# Patient Record
Sex: Female | Born: 1975 | Race: White | Hispanic: No | Marital: Married | Smoking: Never smoker
Health system: Southern US, Community
[De-identification: ages and names within clinical notes are randomized; demographics above are authoritative.]

## PROBLEM LIST (undated history)

## (undated) DIAGNOSIS — T7840XA Allergy, unspecified, initial encounter: Secondary | ICD-10-CM

## (undated) DIAGNOSIS — R51 Headache: Secondary | ICD-10-CM

## (undated) DIAGNOSIS — E079 Disorder of thyroid, unspecified: Secondary | ICD-10-CM

## (undated) DIAGNOSIS — F329 Major depressive disorder, single episode, unspecified: Secondary | ICD-10-CM

## (undated) DIAGNOSIS — F32A Depression, unspecified: Secondary | ICD-10-CM

## (undated) DIAGNOSIS — N390 Urinary tract infection, site not specified: Secondary | ICD-10-CM

## (undated) HISTORY — DX: Headache: R51

## (undated) HISTORY — DX: Depression, unspecified: F32.A

## (undated) HISTORY — DX: Urinary tract infection, site not specified: N39.0

## (undated) HISTORY — DX: Disorder of thyroid, unspecified: E07.9

## (undated) HISTORY — DX: Major depressive disorder, single episode, unspecified: F32.9

## (undated) HISTORY — DX: Allergy, unspecified, initial encounter: T78.40XA

---

## 2002-08-10 ENCOUNTER — Other Ambulatory Visit: Admission: RE | Admit: 2002-08-10 | Discharge: 2002-08-10 | Payer: Self-pay | Admitting: Obstetrics & Gynecology

## 2003-04-26 ENCOUNTER — Inpatient Hospital Stay (HOSPITAL_COMMUNITY): Admission: AD | Admit: 2003-04-26 | Discharge: 2003-04-26 | Payer: Self-pay | Admitting: Obstetrics and Gynecology

## 2003-06-13 ENCOUNTER — Inpatient Hospital Stay (HOSPITAL_COMMUNITY): Admission: AD | Admit: 2003-06-13 | Discharge: 2003-06-13 | Payer: Self-pay | Admitting: Obstetrics and Gynecology

## 2003-06-17 ENCOUNTER — Inpatient Hospital Stay (HOSPITAL_COMMUNITY): Admission: RE | Admit: 2003-06-17 | Discharge: 2003-06-17 | Payer: Self-pay | Admitting: Obstetrics and Gynecology

## 2003-06-20 ENCOUNTER — Inpatient Hospital Stay (HOSPITAL_COMMUNITY): Admission: AD | Admit: 2003-06-20 | Discharge: 2003-06-25 | Payer: Self-pay | Admitting: Obstetrics and Gynecology

## 2003-07-02 ENCOUNTER — Observation Stay (HOSPITAL_COMMUNITY): Admission: AD | Admit: 2003-07-02 | Discharge: 2003-07-03 | Payer: Self-pay | Admitting: Obstetrics and Gynecology

## 2003-08-04 ENCOUNTER — Other Ambulatory Visit: Admission: RE | Admit: 2003-08-04 | Discharge: 2003-08-04 | Payer: Self-pay | Admitting: Obstetrics and Gynecology

## 2004-08-10 ENCOUNTER — Inpatient Hospital Stay (HOSPITAL_COMMUNITY): Admission: RE | Admit: 2004-08-10 | Discharge: 2004-08-13 | Payer: Self-pay | Admitting: Obstetrics and Gynecology

## 2004-08-13 ENCOUNTER — Inpatient Hospital Stay (HOSPITAL_COMMUNITY): Admission: AD | Admit: 2004-08-13 | Discharge: 2004-08-13 | Payer: Self-pay | Admitting: *Deleted

## 2005-04-19 ENCOUNTER — Ambulatory Visit: Payer: Self-pay | Admitting: Cardiology

## 2005-04-19 ENCOUNTER — Ambulatory Visit: Payer: Self-pay | Admitting: Family Medicine

## 2005-04-26 ENCOUNTER — Encounter: Admission: RE | Admit: 2005-04-26 | Discharge: 2005-04-26 | Payer: Self-pay | Admitting: Family Medicine

## 2005-04-27 ENCOUNTER — Encounter: Admission: RE | Admit: 2005-04-27 | Discharge: 2005-04-27 | Payer: Self-pay | Admitting: Family Medicine

## 2005-07-02 ENCOUNTER — Ambulatory Visit: Payer: Self-pay | Admitting: Family Medicine

## 2005-09-13 ENCOUNTER — Ambulatory Visit: Payer: Self-pay | Admitting: Family Medicine

## 2006-05-23 ENCOUNTER — Ambulatory Visit: Payer: Self-pay | Admitting: Family Medicine

## 2006-05-23 LAB — CONVERTED CEMR LAB
ALT: 15 units/L (ref 0–40)
AST: 17 units/L (ref 0–37)
Albumin: 4.1 g/dL (ref 3.5–5.2)
Alkaline Phosphatase: 88 units/L (ref 39–117)
BUN: 15 mg/dL (ref 6–23)
CO2: 29 meq/L (ref 19–32)
Calcium: 9.5 mg/dL (ref 8.4–10.5)
Chloride: 107 meq/L (ref 96–112)
Creatinine, Ser: 0.8 mg/dL (ref 0.4–1.2)
Folate: 7.1 ng/mL
GFR calc non Af Amer: 88 mL/min
Glomerular Filtration Rate, Af Am: 107 mL/min/{1.73_m2}
Glucose, Bld: 85 mg/dL (ref 70–99)
HCT: 37 % (ref 36.0–46.0)
Hemoglobin: 12 g/dL (ref 12.0–15.0)
MCHC: 32.6 g/dL (ref 30.0–36.0)
MCV: 82 fL (ref 78.0–100.0)
Platelets: 269 10*3/uL (ref 150–400)
Potassium: 3.9 meq/L (ref 3.5–5.1)
RBC: 4.51 M/uL (ref 3.87–5.11)
RDW: 16.4 % — ABNORMAL HIGH (ref 11.5–14.6)
Sodium: 141 meq/L (ref 135–145)
TSH: 13.74 microintl units/mL — ABNORMAL HIGH (ref 0.35–5.50)
Total Bilirubin: 0.7 mg/dL (ref 0.3–1.2)
Total Protein: 7.5 g/dL (ref 6.0–8.3)
Vitamin B-12: 316 pg/mL (ref 211–911)
WBC: 9.3 10*3/uL (ref 4.5–10.5)

## 2007-02-13 DIAGNOSIS — R51 Headache: Secondary | ICD-10-CM

## 2007-02-13 DIAGNOSIS — J45909 Unspecified asthma, uncomplicated: Secondary | ICD-10-CM | POA: Insufficient documentation

## 2007-02-13 DIAGNOSIS — F329 Major depressive disorder, single episode, unspecified: Secondary | ICD-10-CM

## 2007-02-13 DIAGNOSIS — R519 Headache, unspecified: Secondary | ICD-10-CM | POA: Insufficient documentation

## 2007-02-13 DIAGNOSIS — E039 Hypothyroidism, unspecified: Secondary | ICD-10-CM | POA: Insufficient documentation

## 2007-07-02 ENCOUNTER — Ambulatory Visit: Payer: Self-pay | Admitting: Family Medicine

## 2007-07-02 LAB — CONVERTED CEMR LAB
Bilirubin Urine: NEGATIVE
Blood in Urine, dipstick: NEGATIVE
Glucose, Urine, Semiquant: NEGATIVE
Ketones, urine, test strip: NEGATIVE
Nitrite: NEGATIVE
Protein, U semiquant: NEGATIVE
Specific Gravity, Urine: <1.005
Urobilinogen, UA: 0.2
WBC Urine, dipstick: NEGATIVE
pH: 7.5

## 2008-01-05 ENCOUNTER — Ambulatory Visit: Payer: Self-pay | Admitting: Family Medicine

## 2008-01-05 LAB — CONVERTED CEMR LAB
Bilirubin Urine: NEGATIVE
Blood in Urine, dipstick: NEGATIVE
Glucose, Urine, Semiquant: NEGATIVE
Ketones, urine, test strip: NEGATIVE
Nitrite: NEGATIVE
Protein, U semiquant: NEGATIVE
Specific Gravity, Urine: <1.005
Urobilinogen, UA: 0.2
WBC Urine, dipstick: NEGATIVE
pH: 6

## 2008-03-07 ENCOUNTER — Ambulatory Visit: Payer: Self-pay | Admitting: Family Medicine

## 2008-04-27 ENCOUNTER — Ambulatory Visit: Payer: Self-pay | Admitting: Family Medicine

## 2008-08-16 ENCOUNTER — Ambulatory Visit: Payer: Self-pay | Admitting: Family Medicine

## 2008-08-16 LAB — CONVERTED CEMR LAB: Rapid Strep: POSITIVE

## 2009-01-31 ENCOUNTER — Ambulatory Visit: Payer: Self-pay | Admitting: Family Medicine

## 2009-02-02 LAB — CONVERTED CEMR LAB
ALT: 25 units/L (ref 0–35)
AST: 23 units/L (ref 0–37)
Albumin: 4 g/dL (ref 3.5–5.2)
Alkaline Phosphatase: 110 units/L (ref 39–117)
BUN: 15 mg/dL (ref 6–23)
Basophils Absolute: 0 10*3/uL (ref 0.0–0.1)
Basophils Relative: 0.3 % (ref 0.0–3.0)
Bilirubin, Direct: 0 mg/dL (ref 0.0–0.3)
CO2: 29 meq/L (ref 19–32)
Calcium: 9.4 mg/dL (ref 8.4–10.5)
Chloride: 106 meq/L (ref 96–112)
Cholesterol: 159 mg/dL (ref 0–200)
Creatinine, Ser: 0.7 mg/dL (ref 0.4–1.2)
Eosinophils Absolute: 0.2 10*3/uL (ref 0.0–0.7)
Eosinophils Relative: 2.5 % (ref 0.0–5.0)
GFR calc non Af Amer: 101.35 mL/min (ref >60)
Glucose, Bld: 85 mg/dL (ref 70–99)
HCT: 36.5 % (ref 36.0–46.0)
HDL: 35.2 mg/dL — ABNORMAL LOW (ref >39.00)
Hemoglobin: 12.3 g/dL (ref 12.0–15.0)
LDL Cholesterol: 105 mg/dL — ABNORMAL HIGH (ref 0–99)
Lymphocytes Relative: 20.5 % (ref 12.0–46.0)
Lymphs Abs: 1.7 10*3/uL (ref 0.7–4.0)
MCHC: 33.8 g/dL (ref 30.0–36.0)
MCV: 85 fL (ref 78.0–100.0)
Monocytes Absolute: 0.5 10*3/uL (ref 0.1–1.0)
Monocytes Relative: 6 % (ref 3.0–12.0)
Neutro Abs: 6 10*3/uL (ref 1.4–7.7)
Neutrophils Relative %: 70.7 % (ref 43.0–77.0)
Platelets: 219 10*3/uL (ref 150.0–400.0)
Potassium: 4.3 meq/L (ref 3.5–5.1)
RBC: 4.3 M/uL (ref 3.87–5.11)
RDW: 14.4 % (ref 11.5–14.6)
Sodium: 140 meq/L (ref 135–145)
TSH: 0.11 microintl units/mL — ABNORMAL LOW (ref 0.35–5.50)
Total Bilirubin: 0.8 mg/dL (ref 0.3–1.2)
Total CHOL/HDL Ratio: 5
Total Protein: 8 g/dL (ref 6.0–8.3)
Triglycerides: 92 mg/dL (ref 0.0–149.0)
VLDL: 18.4 mg/dL (ref 0.0–40.0)
WBC: 8.4 10*3/uL (ref 4.5–10.5)

## 2009-06-07 ENCOUNTER — Ambulatory Visit: Payer: Self-pay | Admitting: Family Medicine

## 2009-06-07 LAB — CONVERTED CEMR LAB: Rapid Strep: NEGATIVE

## 2009-12-06 ENCOUNTER — Ambulatory Visit: Payer: Self-pay | Admitting: Family Medicine

## 2009-12-06 LAB — CONVERTED CEMR LAB: Rapid Strep: NEGATIVE

## 2010-05-21 ENCOUNTER — Ambulatory Visit
Admission: RE | Admit: 2010-05-21 | Discharge: 2010-05-21 | Payer: Self-pay | Source: Home / Self Care | Attending: Family Medicine | Admitting: Family Medicine

## 2010-05-21 DIAGNOSIS — I8390 Asymptomatic varicose veins of unspecified lower extremity: Secondary | ICD-10-CM | POA: Insufficient documentation

## 2010-06-12 NOTE — Assessment & Plan Note (Signed)
Summary: ?strep throat/cjr   Vital Signs:  Patient profile:   35 year old female Weight:      235 pounds BMI:     39.25 Temp:     98.4 degrees F oral BP sitting:   116 / 80  (left arm) Cuff size:   large  Vitals Entered By: Raechel Ache, RN (December 06, 2009 11:45 AM) CC: C/o sore throat, achy, sore neck and weak since last night.   History of Present Illness: Here for another bout of what she thinks is strep throat. She has had 2 days of a severe ST, HA, and swollen nodes in the neck. No fever or sinus pressure or cough. On Motrin. She has averaged getting strep throats once or twice a year for at least 10 years.   Allergies: 1)  ! Pcn  Past History:  Past Medical History: Reviewed history from 02/13/2007 and no changes required. Depression Hypothyroidism Migraines Allergies UTIs Asthma Headache  Past Surgical History: Reviewed history from 02/13/2007 and no changes required. Caesarean section  Review of Systems  The patient denies anorexia, fever, weight loss, weight gain, vision loss, decreased hearing, hoarseness, chest pain, syncope, dyspnea on exertion, peripheral edema, prolonged cough, hemoptysis, abdominal pain, melena, hematochezia, severe indigestion/heartburn, hematuria, incontinence, genital sores, muscle weakness, suspicious skin lesions, transient blindness, difficulty walking, depression, unusual weight change, abnormal bleeding, enlarged lymph nodes, angioedema, breast masses, and testicular masses.    Physical Exam  General:  Well-developed,well-nourished,in no acute distress; alert,appropriate and cooperative throughout examination Head:  Normocephalic and atraumatic without obvious abnormalities. No apparent alopecia or balding. Eyes:  No corneal or conjunctival inflammation noted. EOMI. Perrla. Funduscopic exam benign, without hemorrhages, exudates or papilledema. Vision grossly normal. Ears:  External ear exam shows no significant lesions or  deformities.  Otoscopic examination reveals clear canals, tympanic membranes are intact bilaterally without bulging, retraction, inflammation or discharge. Hearing is grossly normal bilaterally. Nose:  External nasal examination shows no deformity or inflammation. Nasal mucosa are pink and moist without lesions or exudates. Mouth:  clear except for red swollen tonsils  Neck:  No deformities, masses, or tenderness noted. Lungs:  Normal respiratory effort, chest expands symmetrically. Lungs are clear to auscultation, no crackles or wheezes.   Impression & Recommendations:  Problem # 1:  PHARYNGITIS (ICD-462)  Her updated medication list for this problem includes:    Ibuprofen 800 Mg Tabs (Ibuprofen) .Marland Kitchen... 1 every 6 hours as needed pain    Cefdinir 300 Mg Caps (Cefdinir) .Marland Kitchen..Marland Kitchen Two times a day  Orders: Rapid Strep (20254) ENT Referral (ENT)  Complete Medication List: 1)  Synthroid 175 Mcg Tabs (Levothyroxine sodium) .Marland Kitchen.. 1 by mouth once daily 2)  Ibuprofen 800 Mg Tabs (Ibuprofen) .Marland Kitchen.. 1 every 6 hours as needed pain 3)  Cefdinir 300 Mg Caps (Cefdinir) .... Two times a day  Patient Instructions: 1)  This is more than likely an early strep throat so we will treat accordingly. Since she gets these recurrently, we will refer her to ENT to consider a tonsillectomy Prescriptions: CEFDINIR 300 MG CAPS (CEFDINIR) two times a day  #20 x 0   Entered and Authorized by:   Nelwyn Salisbury MD   Signed by:   Nelwyn Salisbury MD on 12/06/2009   Method used:   [facility] to        [facility]  [phone]* (retail)       [address] [place], [place]  [address]  Ph: [phone] or [phone]       Fax: [phone]   RxID:   [id number]   Laboratory Results    Other Tests  Rapid Strep: negative Comments: Rita Ohara  December 06, 2009 11:57 AM   Kit Test Internal QC: Negative   (Normal Range: Negative)

## 2010-06-12 NOTE — Assessment & Plan Note (Signed)
Summary: ? strep//ccm   Vital Signs:  Patient profile:   35 year old female Weight:      238 pounds Temp:     98.4 degrees F oral Pulse rate:   106 / minute BP sitting:   114 / 82  (left arm) Cuff size:   large  Vitals Entered By: Alfred Levins, CMA (June 07, 2009 3:03 PM) CC: st x1 day, fever   History of Present Illness: here for 2 days of bad ST, body aches , and a fever. No sinus symptoms or cough, no NVD. On Tylenol and fluids.   Current Medications (verified): 1)  Synthroid 175 Mcg Tabs (Levothyroxine Sodium) .Marland Kitchen.. 1 By Mouth Once Daily 2)  Ibuprofen 800 Mg Tabs (Ibuprofen) .Marland Kitchen.. 1 Every 6 Hours As Needed Pain  Allergies (verified): 1)  ! Pcn  Past History:  Past Medical History: Reviewed history from 02/13/2007 and no changes required. Depression Hypothyroidism Migraines Allergies UTIs Asthma Headache  Review of Systems  The patient denies anorexia, weight loss, weight gain, vision loss, decreased hearing, hoarseness, chest pain, syncope, dyspnea on exertion, peripheral edema, prolonged cough, headaches, hemoptysis, abdominal pain, melena, hematochezia, severe indigestion/heartburn, hematuria, incontinence, genital sores, muscle weakness, suspicious skin lesions, transient blindness, difficulty walking, depression, unusual weight change, abnormal bleeding, enlarged lymph nodes, angioedema, breast masses, and testicular masses.    Physical Exam  General:  Well-developed,well-nourished,in no acute distress; alert,appropriate and cooperative throughout examination Head:  Normocephalic and atraumatic without obvious abnormalities. No apparent alopecia or balding. Eyes:  No corneal or conjunctival inflammation noted. EOMI. Perrla. Funduscopic exam benign, without hemorrhages, exudates or papilledema. Vision grossly normal. Ears:  External ear exam shows no significant lesions or deformities.  Otoscopic examination reveals clear canals, tympanic membranes are intact  bilaterally without bulging, retraction, inflammation or discharge. Hearing is grossly normal bilaterally. Nose:  External nasal examination shows no deformity or inflammation. Nasal mucosa are pink and moist without lesions or exudates. Mouth:  the tonsils are red, swollen, and covered with exudate Neck:  No deformities, masses, or tenderness noted. Lungs:  Normal respiratory effort, chest expands symmetrically. Lungs are clear to auscultation, no crackles or wheezes.   Impression & Recommendations:  Problem # 1:  PHARYNGITIS (ICD-462)  Her updated medication list for this problem includes:    Ibuprofen 800 Mg Tabs (Ibuprofen) .Marland Kitchen... 1 every 6 hours as needed pain    Cefdinir 300 Mg Caps (Cefdinir) .Marland Kitchen..Marland Kitchen Two times a day  Orders: Rapid Strep (25366)  Complete Medication List: 1)  Synthroid 175 Mcg Tabs (Levothyroxine sodium) .Marland Kitchen.. 1 by mouth once daily 2)  Ibuprofen 800 Mg Tabs (Ibuprofen) .Marland Kitchen.. 1 every 6 hours as needed pain 3)  Cefdinir 300 Mg Caps (Cefdinir) .... Two times a day  Patient Instructions: 1)  Please schedule a follow-up appointment as needed .  Prescriptions: CEFDINIR 300 MG CAPS (CEFDINIR) two times a day  #20 x 0   Entered and Authorized by:   Nelwyn Salisbury MD   Signed by:   Nelwyn Salisbury MD on 06/07/2009   Method used:   [facility]  [address] [id number]       [address]       [place], [place]  [address]       Ph: [phone] or [phone]       Fax: [phone]   RxID:   [id number]  Laboratory Results    Other Tests  Rapid Strep: negative Comments: Rita Ohara  June 07, 2009 3:17 PM   Kit Test Internal QC: Negative   (Normal Range: Negative)

## 2010-06-14 NOTE — Assessment & Plan Note (Signed)
Summary: PAINFUL KNOT ON LEG//SLM   Vital Signs:  Patient profile:   35 year old female Weight:      249 pounds O2 Sat:      96 % Temp:     99 degrees F Pulse rate:   88 / minute BP sitting:   126 / 80  (left arm) Cuff size:   large  Vitals Entered By: Pura Spice, RN (May 21, 2010 4:53 PM) CC: ck rt leg bruised  some pain with walking  c/o bruises all over   History of Present Illness: here for a tender area on the back of her right calf that suddenly appeared yesterday afternoon. She had been sqatting down on the floor with her daughter and went to stand up. When she did so, she had a sudden sharp pain in the calf. Shortly after that the area turned blue and swelled, and it has been tender ever since. This has never happened before. No chest pain or SOB. She does take Excedrin on a daily basis for HAs.   Allergies: 1)  ! Pcn  Past History:  Past Medical History: Reviewed history from 02/13/2007 and no changes required. Depression Hypothyroidism Migraines Allergies UTIs Asthma Headache  Past Surgical History: Reviewed history from 02/13/2007 and no changes required. Caesarean section  Review of Systems  The patient denies anorexia, fever, weight loss, weight gain, vision loss, decreased hearing, hoarseness, chest pain, syncope, dyspnea on exertion, peripheral edema, prolonged cough, headaches, hemoptysis, abdominal pain, melena, hematochezia, severe indigestion/heartburn, hematuria, incontinence, genital sores, muscle weakness, suspicious skin lesions, transient blindness, difficulty walking, depression, unusual weight change, abnormal bleeding, enlarged lymph nodes, angioedema, breast masses, and testicular masses.    Physical Exam  General:  overweight-appearing.   Lungs:  Normal respiratory effort, chest expands symmetrically. Lungs are clear to auscultation, no crackles or wheezes. Heart:  Normal rate and regular rhythm. S1 and S2 normal without gallop,  murmur, click, rub or other extra sounds. Extremities:  no edema. The posterior right calf has an area about 2 cm in diameter which is tender, firm, swollen, and ecchymotic. No cords are felt. Denna Haggard is negative    Impression & Recommendations:  Problem # 1:  VARICOSE VEINS, LOWER EXTREMITIES (ICD-454.9)  Complete Medication List: 1)  Synthroid 175 Mcg Tabs (Levothyroxine sodium) .Marland Kitchen.. 1 by mouth once daily 2)  Ibuprofen 800 Mg Tabs (Ibuprofen) .Marland Kitchen.. 1 every 6 hours as needed pain  Patient Instructions: 1)  this appears to be a varicose vein that has ruptured. The aspirin she takes daily may have contributed to this bleeding. Suggested she use Tylenol instead for her HAs. Stay off her feet for several days, use ice packs to the leg.  2)  It is important that you exercise reguarly at least 20 minutes 5 times a week. If you develop chest pain, have severe difficulty breathing, or feel very tired, stop exercising immediately and seek medical attention.  3)  You need to lose weight. Consider a lower calorie diet and regular exercise.  4)  Please schedule a follow-up appointment as needed .    Orders Added: 1)  Est. Patient Level IV [[address]]

## 2010-07-18 ENCOUNTER — Encounter: Payer: Self-pay | Admitting: Family Medicine

## 2010-07-18 ENCOUNTER — Ambulatory Visit (INDEPENDENT_AMBULATORY_CARE_PROVIDER_SITE_OTHER): Payer: PRIVATE HEALTH INSURANCE | Admitting: Family Medicine

## 2010-07-18 VITALS — BP 130/90 | Temp 99.2°F | Ht 65.6 in | Wt 252.0 lb

## 2010-07-18 DIAGNOSIS — E039 Hypothyroidism, unspecified: Secondary | ICD-10-CM

## 2010-07-18 DIAGNOSIS — R635 Abnormal weight gain: Secondary | ICD-10-CM

## 2010-07-18 LAB — POCT URINALYSIS DIPSTICK
Bilirubin, UA: NEGATIVE
Blood, UA: NEGATIVE
Glucose, UA: NEGATIVE
Ketones, UA: NEGATIVE
Nitrite, UA: NEGATIVE
Protein, UA: NEGATIVE
Spec Grav, UA: 1.005
Urobilinogen, UA: 0.2
pH, UA: 5.5

## 2010-07-18 MED ORDER — LEVOTHYROXINE SODIUM 175 MCG PO TABS: 175.0000 ug | ORAL_TABLET | Freq: Every day | ORAL | Status: DC

## 2010-07-18 NOTE — Progress Notes (Signed)
  Subjective:    Patient ID: Krystal Roberson, female    DOB: May 20, 1975, 35 y.o.   MRN: [id number]  HPI Here to follow up on hypothyroidism and to ask somw questions about weight gain and metabolism. Despite watching a stirct diet and working out 5-6 days a week she has continued to put on some weight over the past year.This is extremely frustrating to her. She feels good in general.   Review of Systems  Constitutional: Positive for fatigue and unexpected weight change. Negative for activity change and appetite change.  Respiratory: Negative.   Cardiovascular: Negative.        Objective:   Physical Exam  Constitutional: She appears well-developed and well-nourished.  Neck: No thyromegaly present.  Cardiovascular: Normal rate, regular rhythm, normal heart sounds and intact distal pulses.   Pulmonary/Chest: Effort normal and breath sounds normal.  Abdominal: Soft. Bowel sounds are normal.  Lymphadenopathy:    She has no cervical adenopathy.          Assessment & Plan:  Obesity and hypothyroidism. Will get a number of labs today

## 2010-07-19 LAB — BASIC METABOLIC PANEL
BUN: 18 mg/dL (ref 6–23)
CO2: 28 mEq/L (ref 19–32)
Calcium: 9.5 mg/dL (ref 8.4–10.5)
Chloride: 102 mEq/L (ref 96–112)
Creatinine, Ser: 0.8 mg/dL (ref 0.4–1.2)
GFR: 81.43 mL/min (ref >60.00)
Glucose, Bld: 87 mg/dL (ref 70–99)
Potassium: 4.4 mEq/L (ref 3.5–5.1)
Sodium: 137 mEq/L (ref 135–145)

## 2010-07-19 LAB — TSH: TSH: 9.53 u[IU]/mL — ABNORMAL HIGH (ref 0.35–5.50)

## 2010-07-19 LAB — HEPATIC FUNCTION PANEL
ALT: 17 U/L (ref 0–35)
AST: 18 U/L (ref 0–37)
Albumin: 4.3 g/dL (ref 3.5–5.2)
Alkaline Phosphatase: 97 U/L (ref 39–117)
Bilirubin, Direct: 0 mg/dL (ref 0.0–0.3)
Total Bilirubin: 0.2 mg/dL — ABNORMAL LOW (ref 0.3–1.2)
Total Protein: 7.8 g/dL (ref 6.0–8.3)

## 2010-07-19 LAB — CBC WITH DIFFERENTIAL/PLATELET
Basophils Absolute: 0.1 10*3/uL (ref 0.0–0.1)
Basophils Relative: 0.6 % (ref 0.0–3.0)
Eosinophils Absolute: 0.3 10*3/uL (ref 0.0–0.7)
Eosinophils Relative: 2.4 % (ref 0.0–5.0)
HCT: 34.5 % — ABNORMAL LOW (ref 36.0–46.0)
Hemoglobin: 11.5 g/dL — ABNORMAL LOW (ref 12.0–15.0)
Lymphocytes Relative: 24.8 % (ref 12.0–46.0)
Lymphs Abs: 2.7 10*3/uL (ref 0.7–4.0)
MCHC: 33.5 g/dL (ref 30.0–36.0)
MCV: 83.1 fl (ref 78.0–100.0)
Monocytes Absolute: 0.6 10*3/uL (ref 0.1–1.0)
Monocytes Relative: 5.4 % (ref 3.0–12.0)
Neutro Abs: 7.3 10*3/uL (ref 1.4–7.7)
Neutrophils Relative %: 66.8 % (ref 43.0–77.0)
Platelets: 266 10*3/uL (ref 150.0–400.0)
RBC: 4.15 Mil/uL (ref 3.87–5.11)
RDW: 15.5 % — ABNORMAL HIGH (ref 11.5–14.6)
WBC: 10.9 10*3/uL — ABNORMAL HIGH (ref 4.5–10.5)

## 2010-07-19 LAB — T4, FREE: Free T4: 0.71 ng/dL (ref 0.60–1.60)

## 2010-07-19 LAB — VITAMIN B12: Vitamin B-12: 226 pg/mL (ref 211–911)

## 2010-07-19 LAB — CORTISOL: Cortisol, Plasma: 4.1 ug/dL

## 2010-07-19 LAB — T3, FREE: T3, Free: 2.8 pg/mL (ref 2.3–4.2)

## 2010-07-24 ENCOUNTER — Telehealth: Payer: Self-pay

## 2010-07-24 NOTE — Telephone Encounter (Signed)
SPOKE WITH PT AND LAB RESULTS GIVEN AND PT WILL CALL BACK WITH NEW INSURANCE CARRIER FOR THE SYNTHROID RX.

## 2010-07-24 NOTE — Telephone Encounter (Signed)
Message copied by Madison Hickman on Tue Jul 24, 2010  8:51 AM ------      Message from: Krystal Roberson      Created: Mon Jul 23, 2010  8:33 AM       Normal except her thyroid levels are a bit low. Increase the Synthroid to 200 mcg a day. Call in 90 day supply and recheck in 90 days

## 2010-07-25 ENCOUNTER — Telehealth: Payer: Self-pay

## 2010-07-25 MED ORDER — LEVOTHYROXINE SODIUM 200 MCG PO TABS: 200.0000 ug | ORAL_TABLET | Freq: Every day | ORAL | Status: DC

## 2010-07-25 NOTE — Telephone Encounter (Signed)
Pt aware and med sent to [facility] [place] [facility].

## 2010-07-25 NOTE — Telephone Encounter (Signed)
Message copied by Madison Hickman on Wed Jul 25, 2010  5:02 PM ------      Message from: Dwaine Deter      Created: Mon Jul 23, 2010  8:33 AM       Normal except her thyroid levels are a bit low. Increase the Synthroid to 200 mcg a day. Call in 90 day supply and recheck in 90 days

## 2010-07-26 ENCOUNTER — Other Ambulatory Visit: Payer: Self-pay

## 2010-07-26 NOTE — Telephone Encounter (Signed)
Spoke with pt and med ordered

## 2010-07-26 NOTE — Telephone Encounter (Signed)
Pt aware med  Called to her new pharmacy

## 2010-08-08 ENCOUNTER — Telehealth: Payer: Self-pay | Admitting: *Deleted

## 2010-08-08 NOTE — Telephone Encounter (Signed)
Left message to call back  

## 2010-08-08 NOTE — Telephone Encounter (Signed)
Pt is not feeling well on her new dosage of thyroid med.  Please call for instructions on any changes.

## 2010-08-08 NOTE — Telephone Encounter (Signed)
I need more information. What does she feel?

## 2010-08-10 NOTE — Telephone Encounter (Signed)
Left message to call back again 

## 2010-09-28 NOTE — Op Note (Signed)
NAME:  Krystal Roberson, Krystal Roberson                        ACCOUNT NO.:  [id number]   MEDICAL RECORD NO.:  [id number]                   PATIENT TYPE:  INP   LOCATION:  9112                                 FACILITY:  [facility]   PHYSICIAN:  Maxie Better, M.D.            DATE OF BIRTH:  05-25-1975   DATE OF PROCEDURE:  06/21/2003  DATE OF DISCHARGE:                                 OPERATIVE REPORT   PREOPERATIVE DIAGNOSES:  1. Arrest of dilatation.  2. Intrauterine gestation at 37+ weeks.  3. Large for gestational age baby.   PROCEDURE:  Primary cesarean section.   POSTOPERATIVE DIAGNOSES:  1. Left occiput transverse presentation.  2. Fetal macrosomia.  3. Intrauterine gestation at 37+ weeks.  4. Arrest of dilatation.   ANESTHESIA:  Continuous spinal.   SURGEON:  Maxie Better, M.D.   INDICATIONS:  This is a 35 year old gravida 3, para 1-0-1-1, female at 22+  weeks' gestation confirmed by early first trimester ultrasound and last  menstrual period, who was admitted on June 20, 2003, for induction of  labor secondary to fetal macrosomia with a favorable cervix.  The patient  underwent an amniocentesis on June 17, 2003, at which time the fetal lung  maturity was documented.  Estimated fetal weight of the baby was 8 pounds 14  ounces.  The patient had a prior 8 pound 3 ounce delivery vaginally without  any complications.  The patient was admitted on June 20, 2003.  She  underwent amniotomy with copious clear amount of fluids identified.  Pitocin  augmentation was subsequently started.  The patient requested an epidural  during her labor and incidentally received a continuous spinal anesthesia,  which resulted in intermittent chest discomfort with lying flat and as well  as a headache with sitting up.  The patient subsequently progressed to 5 cm  and arrested at that dilatation despite adequate contractile pattern and  strength.  Given the examination and the adequacy of labor,  a decision was  then made to proceed with a primary cesarean section.  Risks and benefits of  the procedure had been explained to the patient.  Consent was signed.  The  patient was transferred to the operating room.   DESCRIPTION OF PROCEDURE:  Under adequate continuous spinal anesthesia, the  patient was placed in the supine position with a left lateral tilt.  An  indwelling Foley catheter had been place during labor and was noted to have  some blood in the urine prior to transfer to the operating room.  The  patient was sterilely prepped and draped in the usual fashion.  Marcaine  0.25% 10 mL was injected along the planned Pfannenstiel skin incision.  The  Pfannenstiel skin incision was then made, carried down to the rectus fascia.  The rectus fascia was incised in the midline and extended bilaterally.  The  rectus fascia was then bluntly and sharply dissected off the rectus muscle  in superior  and inferior fashion.  The rectus muscle was split in the  midline and the parietal peritoneum entered bluntly.  The vesicouterine  peritoneum was difficult to tent unless a bladder flap was created.  A  curvilinear low transverse uterine incision was then made and extended  bilaterally using bandage scissors.  Subsequent entry into the pelvis was  notable for a large amount of loops of cord.  Subsequent delivery was  accomplished of a live female from a left occiput transverse position.  The  baby's cord was clamped, cut, and the baby was transferred to the awaiting  pediatricians, who subsequently assigned Apgars of 8 and 9 at one and five  minutes.  Cord pH was obtained, which was 7.25.  The placenta, which was  anterior, was manually and spontaneously removed.  The uterine cavity was  then cleaned of debris.  No uterine extension was noted.  The uterine  incision was closed in two layers, the first layer with 0 Monocryl running  locked stitch, the second layer was with 0 Monocryl, some of  which was  running locked stitch secondary to large blood vessels noted.  There was a  figure-of-eight suture placed in the right aspect of the middle portion of  the incision due to a pumping vessel.  With good hemostasis subsequently  noted, the vesicouterine peritoneum was further investigated, small bleeders  cauterized.  Normal tubes and ovaries are noted bilaterally.  At this point  a large amount of dilated loops of bowel was noted, which limited the  procedure.  Nonetheless, the bowels were gently packed upwardly and the  pelvis was irrigated, suctioned of debris.  The parietal peritoneum was not  closed.  The rectus fascia was inspected.  The undersurface of the rectus  fascia as inspected for any bleeders.  These were cauterized.  The rectus  fascia was closed with 0 Vicryl x2.  The subcutaneous area, which was of a  good depth, was irrigated, suctioned, and small bleeders cauterized.  The  skin edges were approximated using the Ethicon staples.   SPECIMENS:  Placenta.   ESTIMATED BLOOD LOSS:  1000 mL.   INTRAOPERATIVE FLUID:  1400 mL.   URINE OUTPUT:  100 mL urine.   The sponge and instrument counts x2 were correct.   COMPLICATIONS:  None.   The patient tolerated the procedure and was transferred to the recovery room  in stable condition.  Weight of the baby was 8 pounds 13 ounces.                                               Maxie Better, M.D.    /MEDQ  D:  06/21/2003  T:  06/21/2003  Job:  [id number]

## 2010-09-28 NOTE — Op Note (Signed)
NAMECATHELEEN, Krystal Roberson              ACCOUNT NO.:  [id number]   MEDICAL RECORD NO.:  [id number]          PATIENT TYPE:  INP   LOCATION:  9198                          FACILITY:  [facility]   PHYSICIAN:  Maxie Better, M.D.DATE OF BIRTH:  13-Feb-1976   DATE OF PROCEDURE:  08/10/2004  DATE OF DISCHARGE:                                 OPERATIVE REPORT   PREOPERATIVE DIAGNOSES:  1.  Previous cesarean section.  2.  Term gestation.   POSTOPERATIVE DIAGNOSIS:  1.  Previous cesarean section.  2.  Term gestation.   PROCEDURE:  Repeat cesarean section per hysterotomy.   SURGEON:  Maxie Better, M.D.   ASSISTANT:  Richardean Sale, M.D.   ANESTHESIA:  Spinal.   FINDINGS:  Live 9-pound 5-ounce female, Apgars of 9 and 10, posterior  placenta, normal tubes and ovaries.   PROCEDURE:  Under adequate spinal anesthesia, the patient was placed in the  supine position with a left lateral tilt.  She was sterilely prepped and  draped in usual fashion.  Indwelling Foley catheter was sterilely placed.  Ten milliliters of 0.25% Marcaine were injected along the previous  Pfannenstiel skin incision.  A Pfannenstiel skin incision was then made and  carried down through the rectus fascia.  Rectus fascia was incised in the  midline and extended transversely.  The rectus fascia was then bluntly and  sharply dissected off the rectus muscle in a superior and inferior fashion.  The rectus muscle was split in the midline; the parietal peritoneum was also  entered at that time.  The vesicouterine peritoneum was then opened  transversely; the bladder was then bluntly dissected off the lower uterine  segment and displaced inferiorly.  A curvilinear low transverse incision was  then made and extended bilaterally using bandage scissors.  Artificial  rupture of membranes was performed; clear copious amniotic fluid was noted.  Subsequently, a live female from direct occipitoposterior presentation was  delivered.   The baby was bulb-suctioned on the abdomen.  Cord was clamped  and cut, and the baby was transferred to the awaiting pediatrician, who  assigned Apgars of 9 and 10.  The placenta was posterior and was manually  removed.  Uterine cavity was cleaned of debris multiple times.  The uterine  incision was without an extension.  The uterine incision was closed in 2  layers, the first layer with 0 Monocryl running-locked stitch, second layer  was imbricated using 0 Monocryl suture.  Good hemostasis was noted.  The  abdomen was copiously irrigated, suctioned of debris.  The parietal  peritoneum was closed with 0 Vicryl running stitch.  The rectus fascia was  closed with 0 Vicryl x2.  The subcutaneous area was irrigated, suctioned and  then closed with interrupted 2-0 plain sutures and the skin approximated  using Ethicon staples.  Specimen was placenta and sent to pathology.  Estimated blood loss was 800  mL.  Urine output was 250 mL of clear yellow urine.  Intraoperative fluid  was 2.5 L.  Sponge and instrument counts x2 were correct.  Complication was  none.  The patient tolerated the procedure well  and was transferred to the  recovery room in stable condition.      [place]/MEDQ  D:  08/10/2004  T:  08/10/2004  Job:  [id number]

## 2010-09-28 NOTE — Discharge Summary (Signed)
NAME:  Krystal Roberson, Krystal Roberson                        ACCOUNT NO.:  [id number]   MEDICAL RECORD NO.:  [id number]                   PATIENT TYPE:  INP   LOCATION:  9112                                 FACILITY:  [facility]   PHYSICIAN:  Maxie Better, M.D.            DATE OF BIRTH:  04-02-1976   DATE OF ADMISSION:  06/20/2003  DATE OF DISCHARGE:  06/25/2003                                 DISCHARGE SUMMARY   ADMITTING DIAGNOSES:  1. Fetal macrosomia.  2. Intrauterine gestation at 37-plus weeks.  3. Hypothyroidism.  4. Rh negative.  5. Recurrent urinary tract infection.   DISCHARGE DIAGNOSES:  1. Intrauterine gestation at 37-plus weeks delivered.  2. Hypothyroidism.  3. Recurrent urinary tract infection.  4. Rh negative.  5. Postoperative anemia.  6. Postoperative fever.  7. Fetal macrosomia.   PROCEDURE:  Primary cesarean section.   HISTORY OF PRESENT ILLNESS:  [age]-year-old gravida 3, para 1-0-1-1  female at 37-plus weeks gestation with fetal macrosomia and a favorable  cervix admitted for induction of labor.  Estimated fetal weight was 8 pounds  14 ounces.  Mature fetal lung studies were noted on amniocentesis on  June 17, 2003.  Patient has had irregular contractions, intact membranes,  group B streptococcus culture is negative.  Prenatal course had been  complicated by recurrent urinary tract infection for which the patient was  placed on suppressive therapy.  She had an abnormal 1-hour glucose challenge  test and a normal 3-hour glucose challenge test.   HOSPITAL COURSE:  The patient was admitted.  Her exam was a loose 3 cm, 50%,  -3, vertex presentation.  She was placed on the monitor which was reactive  contracting every 2-6 minutes, artificial rupture of membranes was  performed, clear fluid was noted, low-dose Pitocin was started.  The patient  requested epidural for pain management however, she received a continuous  spinal.  Intrauterine pressure catheter was  subsequently placed and was  noted to have suboptimal labor pattern.  Pitocin was continued to be  increased.  The patient developed a headache with sitting up and chest  pressure and a seizure consultation was obtained.  The patient subsequently  attained adequate contractile patterns from 220 to 240 [place] units.  She remained at 5 cm, 75%, -3 despite continued adequate labor pattern.  The  patient was also troubled with the headache which was not being maintained  with the pain medicines.  Patient was therefore taken to the operating room  where she underwent a primary cesarean section, live female in the left  occiput transverse position with loops of cord adjacent to the chest noted,  Apgars of 8 and 9, cord pH of 7.25, normal tubes and ovaries.  The patient's  CBC on postop day #1 showed a hemoglobin of 7.6, white count of 9.5,  platelet count of 182,000.  The patient's admission CBC was a hemoglobin of  10.7, white count of 7.8, hemoglobin  30.7.  The patient was continued on  Synthroid as well as her Macrobid for her history of recurrent urinary tract  infection.  The baby ultimately weighed 8 pounds 13 ounces.  The patient was  asymptomatic from her anemia.  Iron supplementation was started.  Patient on  postop day #2 had a temperature maximum of 102.9, her exam did not reveal  any source of her fever, antibiotics were not started, CBC was obtained  showing a white count of 7, hemoglobin of 7, urinalysis was negative, her  incision was intact, no evidence of infection or seroma.  On postop day #3  the patient complained of thoracic pressure which ultimately felt like more  of a burning reflux, patient was started on Zantac 150 mg one p.o. b.i.d.,  repeat CBC and echocardiogram were done, no evidence of MI or cardiac  origin, thought this was probably secondary to gastroesophageal reflux.  Postop day #4 her chest discomfort was relieved by Zantac, she was passing  flatus, she was  not dizzy, she was not short of breath, incision was fine,  she was afebrile and was thought to be able to be discharged home.   DISPOSITION:  Home.   CONDITION:  Stable.   DISCHARGE MEDICATIONS:  1. Iron supplementation one p.o. t.i.d.  2. Tylox one to two tablets every 3-4 hours p.r.n. pain.  3. Prenatal vitamins one p.o. once daily.  4. Continue her Synthroid.  5. Continue Macrobid.   DISCHARGE INSTRUCTIONS:  Per postpartum booklet.   FOLLOWUP APPOINTMENT:  Four weeks.                                               Maxie Better, M.D.    /MEDQ  D:  08/12/2003  T:  08/15/2003  Job:  [id number]

## 2010-09-28 NOTE — Discharge Summary (Signed)
Krystal Roberson, Krystal Roberson              ACCOUNT NO.:  [id number]   MEDICAL RECORD NO.:  [id number]          PATIENT TYPE:  INP   LOCATION:  9124                          [facility]ACILITY:  [facility]   PHYSICIAN:  Maxie Better, M.D.DATE OF BIRTH:  23-Apr-1976   DATE OF ADMISSION:  08/10/2004  DATE OF DISCHARGE:  08/13/2004                                 DISCHARGE SUMMARY   ADMISSION DIAGNOSES:  1.  Previous cesarean section.  2.  Term gestation.  3.  Hypothyroidism.  4.  Depression.  5.  Iron-deficiency anemia.   DISCHARGE DIAGNOSIS:  1.  Term gestation, delivered.  2.  Hypothyroidism.  3.  Depression.  4.  Iron-deficiency anemia.   PROCEDURE:  Repeat cesarean section.   HISTORY OF PRESENT ILLNESS:  A 35 year old gravida 4, para 2-0-1-2 female at  9 weeks with a previous cesarean section admitted for repeat C-section.  The patient was noted to be Rh negative.  Her prenatal course was  complicated by borderline polyhydramnios, placenta previa that subsequently  resolved, ongoing depression which was controlled with Wellbutrin,  hypothyroidism for which she was on Synthroid.   HOSPITAL COURSE:  The patient was admitted to [facility].  She was  taken to the operating room where she underwent a repeat Cesarean section.  The procedure resulted in a 9 pound, 5 ounce live female, Apgars of 9 and  10.  Her postoperative course was unremarkable.  She was noted to have a  baby with blood type Rh positive and therefore, she was given RhoGAM.  She  was continued on her Synthroid as well as her Wellbutrin postoperatively.  On postoperative day #3, the patient was tolerating a regular diet, no  evidence of infection and seemed well to be discharged home.  Her CBC on  postoperative day #1 showed a hemoglobin of 8.4, white count of 7.9,  hematocrit 24.8.  Her incisions showed no erythema, induration, or exudate.  Her staples were removed.   DISPOSITION:  Home.   CONDITION:  Stable.   DISCHARGE MEDICATIONS:  1.  Chromagen Forte one p.o. daily.  2.  Tylox #30, one p.o. q.4h. p.r.n. pain.  3.  Continue on her Wellbutrin XL 150, one p.o. daily.  4.  Synthroid 0.137 mg p.o. daily.   FOLLOW-UP APPOINTMENTS:  At [facility] in 4 weeks.   DISCHARGE INSTRUCTIONS:  Per the postpartum booklet given.      [place]/MEDQ  D:  08/13/2004  T:  08/13/2004  Job:  [id number]

## 2010-10-23 ENCOUNTER — Ambulatory Visit (INDEPENDENT_AMBULATORY_CARE_PROVIDER_SITE_OTHER): Payer: PRIVATE HEALTH INSURANCE | Admitting: Internal Medicine

## 2010-10-23 ENCOUNTER — Encounter: Payer: Self-pay | Admitting: Internal Medicine

## 2010-10-23 VITALS — BP 118/70 | HR 69 | Temp 99.5°F | Wt 247.0 lb

## 2010-10-23 DIAGNOSIS — K529 Noninfective gastroenteritis and colitis, unspecified: Secondary | ICD-10-CM

## 2010-10-23 DIAGNOSIS — K5289 Other specified noninfective gastroenteritis and colitis: Secondary | ICD-10-CM

## 2010-10-23 MED ORDER — ONDANSETRON HCL 8 MG PO TABS: 8.0000 mg | ORAL_TABLET | Freq: Three times a day (TID) | ORAL | Status: AC | PRN

## 2010-10-23 NOTE — Progress Notes (Signed)
  Subjective:    Patient ID: Krystal Roberson, female    DOB: 11/18/1975, 35 y.o.   MRN: [id number]  HPI Pt presents to clinic for evaluation of nausea. Woke up t this morning with fever chills diffuse myalgias frontal headache and nausea without emesis. No change in bowel habits specifically denying diarrhea or blood per rectum. Has taken Motrin when necessary with some improvement of symptoms. Has attempted to increase by mouth intake of fluids. Denies known sick exposure. No exacerbating or alleviating factors. No obvious abdominal pain. No complaints  Reviewed past medical history, medications and allergies.  Review of Systems see hpi     Objective:   Physical Exam  Nursing note and vitals reviewed. Constitutional: She appears well-developed and well-nourished. No distress.  HENT:  Head: Normocephalic and atraumatic.  Right Ear: External ear normal.  Left Ear: External ear normal.  Mouth/Throat: Oropharynx is clear and moist. No oropharyngeal exudate.  Eyes: Conjunctivae are normal. Right eye exhibits no discharge. Left eye exhibits no discharge. No scleral icterus.  Neck: Neck supple.  Cardiovascular: Normal rate, regular rhythm and normal heart sounds.  Exam reveals no gallop and no friction rub.   No murmur heard. Pulmonary/Chest: Effort normal and breath sounds normal. No respiratory distress. She has no wheezes. She has no rales.  Abdominal: Soft. Bowel sounds are normal. She exhibits no distension. There is no tenderness. There is no rebound and no guarding.  Lymphadenopathy:    She has no cervical adenopathy.  Neurological: She is alert.  Skin: Skin is warm and dry. No rash noted. She is not diaphoretic. No erythema.  Psychiatric: She has a normal mood and affect.          Assessment & Plan:

## 2010-10-23 NOTE — Assessment & Plan Note (Signed)
Clinical presentation most consistent with suspected viral gastroenteritis. Attempt Zofran when necessary. Increase by mouth intake of fluid.Followup if no improvement or worsening.

## 2011-06-17 ENCOUNTER — Ambulatory Visit (INDEPENDENT_AMBULATORY_CARE_PROVIDER_SITE_OTHER): Payer: BC Managed Care – PPO | Admitting: Family Medicine

## 2011-06-17 ENCOUNTER — Encounter: Payer: Self-pay | Admitting: Family Medicine

## 2011-06-17 VITALS — BP 122/74 | HR 97 | Temp 98.7°F | Wt 247.0 lb

## 2011-06-17 DIAGNOSIS — J40 Bronchitis, not specified as acute or chronic: Secondary | ICD-10-CM

## 2011-06-17 MED ORDER — AZITHROMYCIN 250 MG PO TABS: ORAL_TABLET | ORAL | Status: AC

## 2011-06-17 MED ORDER — HYDROCODONE-HOMATROPINE 5-1.5 MG/5ML PO SYRP: 5.0000 mL | ORAL_SOLUTION | ORAL | Status: AC | PRN

## 2011-06-17 NOTE — Progress Notes (Signed)
  Subjective:    Patient ID: Krystal Roberson, female    DOB: 12/10/1975, 36 y.o.   MRN: [id number]  HPI Here for 10 days of sinus pressure, PND, chest congestion, and coughing up green sputum. No fever. On Mucinex   Review of Systems  Constitutional: Negative.   HENT: Positive for congestion, postnasal drip and sinus pressure.   Eyes: Negative.   Respiratory: Positive for cough.        Objective:   Physical Exam  Constitutional: She appears well-developed and well-nourished.  HENT:  Right Ear: External ear normal.  Left Ear: External ear normal.  Nose: Nose normal.  Mouth/Throat: Oropharynx is clear and moist. No oropharyngeal exudate.  Eyes: Conjunctivae are normal.  Pulmonary/Chest: Effort normal and breath sounds normal. No respiratory distress. She has no wheezes. She has no rales. She exhibits no tenderness.  Lymphadenopathy:    She has no cervical adenopathy.          Assessment & Plan:  Recheck prn

## 2011-08-26 ENCOUNTER — Ambulatory Visit (INDEPENDENT_AMBULATORY_CARE_PROVIDER_SITE_OTHER): Payer: BC Managed Care – PPO | Admitting: Family Medicine

## 2011-08-26 ENCOUNTER — Encounter: Payer: Self-pay | Admitting: Family Medicine

## 2011-08-26 VITALS — BP 114/68 | HR 96 | Temp 99.1°F | Wt 250.0 lb

## 2011-08-26 DIAGNOSIS — J029 Acute pharyngitis, unspecified: Secondary | ICD-10-CM

## 2011-08-26 MED ORDER — CEPHALEXIN 500 MG PO CAPS: 500.0000 mg | ORAL_CAPSULE | Freq: Three times a day (TID) | ORAL | Status: AC

## 2011-08-26 NOTE — Progress Notes (Signed)
  Subjective:    Patient ID: Krystal Roberson, female    DOB: 1975/09/03, 36 y.o.   MRN: [id number]  HPI Here for 2 days of low grade fevers, nausea, and a ST. No cough or sinus pressure. Her 55 yr old daughter has been diagnosed with a strep throat and was started on antibiotics today.    Review of Systems  Constitutional: Positive for fever.  HENT: Positive for sore throat. Negative for congestion, postnasal drip and sinus pressure.   Respiratory: Negative.   Gastrointestinal: Positive for nausea. Negative for vomiting, abdominal pain, diarrhea, constipation and abdominal distention.       Objective:   Physical Exam  Constitutional: She appears well-developed and well-nourished.  HENT:  Right Ear: External ear normal.  Left Ear: External ear normal.  Nose: Nose normal.  Mouth/Throat: Oropharynx is clear and moist. No oropharyngeal exudate.  Eyes: Conjunctivae are normal.  Neck: No thyromegaly present.  Pulmonary/Chest: Effort normal and breath sounds normal.  Lymphadenopathy:    She has no cervical adenopathy.          Assessment & Plan:  Probable early strep throat. Treat with Keflex.

## 2011-10-31 ENCOUNTER — Ambulatory Visit: Payer: BC Managed Care – PPO | Admitting: Family Medicine

## 2011-11-04 ENCOUNTER — Encounter: Payer: Self-pay | Admitting: Family Medicine

## 2011-11-04 ENCOUNTER — Ambulatory Visit (INDEPENDENT_AMBULATORY_CARE_PROVIDER_SITE_OTHER): Payer: BC Managed Care – PPO | Admitting: Family Medicine

## 2011-11-04 VITALS — BP 116/70 | HR 95 | Temp 98.6°F | Wt 260.0 lb

## 2011-11-04 DIAGNOSIS — R438 Other disturbances of smell and taste: Secondary | ICD-10-CM

## 2011-11-04 DIAGNOSIS — R635 Abnormal weight gain: Secondary | ICD-10-CM

## 2011-11-04 DIAGNOSIS — E039 Hypothyroidism, unspecified: Secondary | ICD-10-CM

## 2011-11-04 DIAGNOSIS — R439 Unspecified disturbances of smell and taste: Secondary | ICD-10-CM

## 2011-11-04 LAB — POCT URINALYSIS DIPSTICK
Bilirubin, UA: NEGATIVE
Glucose, UA: NEGATIVE
Ketones, UA: NEGATIVE
Leukocytes, UA: NEGATIVE
Nitrite, UA: NEGATIVE

## 2011-11-04 LAB — CBC WITH DIFFERENTIAL/PLATELET
Basophils Absolute: 0 10*3/uL (ref 0.0–0.1)
Basophils Relative: 0.4 % (ref 0.0–3.0)
Eosinophils Absolute: 0.3 10*3/uL (ref 0.0–0.7)
Hemoglobin: 10.2 g/dL — ABNORMAL LOW (ref 12.0–15.0)
Lymphocytes Relative: 23.5 % (ref 12.0–46.0)
MCHC: 32.4 g/dL (ref 30.0–36.0)
Monocytes Relative: 4.8 % (ref 3.0–12.0)
Neutro Abs: 5.5 10*3/uL (ref 1.4–7.7)
Neutrophils Relative %: 67.5 % (ref 43.0–77.0)
RBC: 3.74 Mil/uL — ABNORMAL LOW (ref 3.87–5.11)

## 2011-11-04 LAB — LIPID PANEL
Cholesterol: 144 mg/dL (ref 0–200)
HDL: 36.2 mg/dL — ABNORMAL LOW (ref >39.00)
LDL Cholesterol: 82 mg/dL (ref 0–99)
Total CHOL/HDL Ratio: 4
Triglycerides: 129 mg/dL (ref 0.0–149.0)
VLDL: 25.8 mg/dL (ref 0.0–40.0)

## 2011-11-04 LAB — BASIC METABOLIC PANEL
BUN: 14 mg/dL (ref 6–23)
CO2: 28 mEq/L (ref 19–32)
Calcium: 8.4 mg/dL (ref 8.4–10.5)
Chloride: 102 mEq/L (ref 96–112)
Creatinine, Ser: 0.7 mg/dL (ref 0.4–1.2)
GFR: 104.96 mL/min (ref >60.00)
Glucose, Bld: 124 mg/dL — ABNORMAL HIGH (ref 70–99)
Potassium: 3.7 mEq/L (ref 3.5–5.1)
Sodium: 137 mEq/L (ref 135–145)

## 2011-11-04 LAB — HEPATIC FUNCTION PANEL
Albumin: 3.4 g/dL — ABNORMAL LOW (ref 3.5–5.2)
Total Bilirubin: 0.2 mg/dL — ABNORMAL LOW (ref 0.3–1.2)

## 2011-11-04 LAB — T4, FREE: Free T4: 0.78 ng/dL (ref 0.60–1.60)

## 2011-11-04 LAB — TSH: TSH: 7.51 u[IU]/mL — ABNORMAL HIGH (ref 0.35–5.50)

## 2011-11-04 MED ORDER — LEVOTHYROXINE SODIUM 175 MCG PO TABS: 175.0000 ug | ORAL_TABLET | Freq: Every day | ORAL | Status: DC

## 2011-11-04 NOTE — Progress Notes (Signed)
  Subjective:    Patient ID: Krystal Roberson, female    DOB: 1975/12/26, 36 y.o.   MRN: [id number]  HPI Here to recheck her thyroid and for one week of a metallic taste in her mouth. No sinus congestion or PND. No GERD symptoms. No recent changes in medications and she does not take vitamins. No recent chemical exposures or dental work. She is frustrated at her continual weight gain despite diet and exercise efforts. We increased her Synthroid dose last year, and she was to have had her levels rechecked 90 days later, but she did not return until today. She asks to be referred to an Endocrinologist.   Review of Systems  Constitutional: Positive for unexpected weight change. Negative for activity change and appetite change.  Respiratory: Negative.   Cardiovascular: Negative.        Objective:   Physical Exam  Constitutional: She appears well-developed and well-nourished.  HENT:  Right Ear: External ear normal.  Left Ear: External ear normal.  Nose: Nose normal.  Mouth/Throat: Oropharynx is clear and moist. No oropharyngeal exudate.  Neck: No thyromegaly present.  Cardiovascular: Normal rate, regular rhythm, normal heart sounds and intact distal pulses.   Pulmonary/Chest: Effort normal and breath sounds normal.  Lymphadenopathy:    She has no cervical adenopathy.          Assessment & Plan:  We will check labs today. Refer her to Endocrine per her request. I have no idea where this metallic taste may be coming from.

## 2011-11-06 ENCOUNTER — Encounter: Payer: Self-pay | Admitting: Family Medicine

## 2011-11-06 NOTE — Progress Notes (Signed)
Quick Note:  I tried to reach pt by phone, no answer. I put a copy of results in mail. ______ 

## 2011-11-18 ENCOUNTER — Other Ambulatory Visit: Payer: Self-pay | Admitting: Family Medicine

## 2011-11-18 ENCOUNTER — Telehealth: Payer: Self-pay | Admitting: Family Medicine

## 2011-11-18 DIAGNOSIS — E039 Hypothyroidism, unspecified: Secondary | ICD-10-CM

## 2011-11-18 NOTE — Telephone Encounter (Signed)
Refill request for Synthroid 175 mcg take 1 po qd ( [place] mail order 90 day supply ). What is the correct dose?

## 2011-11-18 NOTE — Telephone Encounter (Signed)
I left voice message for pt to return my call. There is 2 different dosages on the Synthroid, just need pt to clarify the order?

## 2011-11-19 NOTE — Telephone Encounter (Signed)
The day I saw her, she wanted to be referred to an Endocrinologist. Did she change her mind? Does she want me to treat her thyroid?

## 2011-11-19 NOTE — Telephone Encounter (Signed)
I left voice message with the below information and pt needs to return our call.

## 2011-11-20 MED ORDER — LEVOTHYROXINE SODIUM 175 MCG PO TABS: 175.0000 ug | ORAL_TABLET | Freq: Every day | ORAL | Status: DC

## 2011-11-20 NOTE — Telephone Encounter (Signed)
Pt left a voice message. She has an appointment with a Endocrinologist in 6 weeks. She is going to continue with 175 mcg, please call in to [facility] in [place].

## 2011-11-20 NOTE — Telephone Encounter (Signed)
I sent script e-scribe. 

## 2011-11-21 ENCOUNTER — Other Ambulatory Visit: Payer: Self-pay | Admitting: Family Medicine

## 2012-08-13 ENCOUNTER — Ambulatory Visit (INDEPENDENT_AMBULATORY_CARE_PROVIDER_SITE_OTHER): Payer: BC Managed Care – PPO | Admitting: Internal Medicine

## 2012-08-13 ENCOUNTER — Encounter: Payer: Self-pay | Admitting: Internal Medicine

## 2012-08-13 VITALS — BP 130/80 | HR 92 | Temp 98.7°F | Resp 20 | Wt 254.0 lb

## 2012-08-13 DIAGNOSIS — M549 Dorsalgia, unspecified: Secondary | ICD-10-CM

## 2012-08-13 MED ORDER — HYDROCODONE-ACETAMINOPHEN 5-500 MG PO TABS: 1.0000 | ORAL_TABLET | Freq: Three times a day (TID) | ORAL | Status: DC | PRN

## 2012-08-13 MED ORDER — CYCLOBENZAPRINE HCL 10 MG PO TABS: 10.0000 mg | ORAL_TABLET | Freq: Three times a day (TID) | ORAL | Status: DC | PRN

## 2012-08-13 MED ORDER — METHYLPREDNISOLONE ACETATE 80 MG/ML IJ SUSP
80.0000 mg | Freq: Once | INTRAMUSCULAR | Status: AC
  Administered 2012-08-13: 80 mg via INTRAMUSCULAR

## 2012-08-13 NOTE — Progress Notes (Signed)
Subjective:    Patient ID: Krystal Roberson, female    DOB: June 13, 1975, 37 y.o.   MRN: [id number]  HPI  37 year old patient who presents with a chief complaint of low back pain. Yesterday evening the patient stepped in a hole tripped and nearly fell.  She felt that she strained her back mildly at that time but did not experience acute pain. Earlier today the patient began having significant lumbar pain with radiation to the above neck area bilaterally left greater than the right. Pain is aggravated by movement and alleviated by rest  Past Medical History  Diagnosis Date  . Depression   . Thyroid disease   . Migraine   . Allergy   . UTI (lower urinary tract infection)   . Asthma   . Headache     History   Social History  . Marital Status: Married    Spouse Name: N/A    Number of Children: N/A  . Years of Education: N/A   Occupational History  . Not on file.   Social History Main Topics  . Smoking status: Never Smoker   . Smokeless tobacco: Never Used  . Alcohol Use: Yes     Comment: couple times of year  . Drug Use: No  . Sexually Active: Not on file   Other Topics Concern  . Not on file   Social History Narrative  . No narrative on file    Past Surgical History  Procedure Laterality Date  . Cesarean section      Family History  Problem Relation Age of Onset  . Arthritis Other   . Cancer Other     breast, 1st degree, less 50  . Diabetes Other     1st degree   . Hypertension Other   . Depression Other   . Stroke Other     1st degree, less 50  . Coronary artery disease Other     Allergies  Allergen Reactions  . Penicillins     No current outpatient prescriptions on file prior to visit.   No current facility-administered medications on file prior to visit.    BP 130/80  Pulse 92  Temp(Src) 98.7 F (37.1 C) (Oral)  Resp 20  Wt 254 lb (115.214 kg)  BMI 41.51 kg/m2  SpO2 98%  LMP 07/15/2012       Review of Systems  Constitutional:  Negative.   HENT: Negative for hearing loss, congestion, sore throat, rhinorrhea, dental problem, sinus pressure and tinnitus.   Eyes: Negative for pain, discharge and visual disturbance.  Respiratory: Negative for cough and shortness of breath.   Cardiovascular: Negative for chest pain, palpitations and leg swelling.  Gastrointestinal: Negative for nausea, vomiting, abdominal pain, diarrhea, constipation, blood in stool and abdominal distention.  Genitourinary: Negative for dysuria, urgency, frequency, hematuria, flank pain, vaginal bleeding, vaginal discharge, difficulty urinating, vaginal pain and pelvic pain.  Musculoskeletal: Positive for back pain. Negative for joint swelling, arthralgias and gait problem.  Skin: Negative for rash.  Neurological: Negative for dizziness, syncope, speech difficulty, weakness, numbness and headaches.  Hematological: Negative for adenopathy.  Psychiatric/Behavioral: Negative for behavioral problems, dysphoric mood and agitation. The patient is not nervous/anxious.        Objective:   Physical Exam  Constitutional: She appears well-developed and well-nourished.  Uncomfortable but in no acute distress able to stand from the sitting position and ambulate to the examining table  Musculoskeletal:  Tenderness over the lumbar spinous processes Straight leg test normal Motor exam normal  Reflexes brisk and equal          Assessment & Plan:   Lumbar strain. The patient will apply ice for the next 24 hours we'll treat symptomatically with analgesics anti-inflammatories and muscle  relaxers. Will call if unimproved

## 2012-08-13 NOTE — Patient Instructions (Signed)
You  may move around, but avoid painful motions and activities.  Apply ice to the sore area for 15 to 20 minutes 3 or 4 times daily for the next two to 3 days.  Lumbosacral Strain Lumbosacral strain is one of the most common causes of back pain. There are many causes of back pain. Most are not serious conditions. CAUSES  Your backbone (spinal column) is made up of 24 main vertebral bodies, the sacrum, and the coccyx. These are held together by muscles and tough, fibrous tissue (ligaments). Nerve roots pass through the openings between the vertebrae. A sudden move or injury to the back may cause injury to, or pressure on, these nerves. This may result in localized back pain or pain movement (radiation) into the buttocks, down the leg, and into the foot. Sharp, shooting pain from the buttock down the back of the leg (sciatica) is frequently associated with a ruptured (herniated) disk. Pain may be caused by muscle spasm alone. Your caregiver can often find the cause of your pain by the details of your symptoms and an exam. In some cases, you may need tests (such as X-rays). Your caregiver will work with you to decide if any tests are needed based on your specific exam. HOME CARE INSTRUCTIONS   Avoid an underactive lifestyle. Active exercise, as directed by your caregiver, is your greatest weapon against back pain.  Avoid hard physical activities (tennis, racquetball, waterskiing) if you are not in proper physical condition for it. This may aggravate or create problems.  If you have a back problem, avoid sports requiring sudden body movements. Swimming and walking are generally safer activities.  Maintain good posture.  Avoid becoming overweight (obese).  Use bed rest for only the most extreme, sudden (acute) episode. Your caregiver will help you determine how much bed rest is necessary.  For acute conditions, you may put ice on the injured area.  Put ice in a plastic bag.  Place a towel between  your skin and the bag.  Leave the ice on for 15 to 20 minutes at a time, every 2 hours, or as needed.  After you are improved and more active, it may help to apply heat for 30 minutes before activities. See your caregiver if you are having pain that lasts longer than expected. Your caregiver can advise appropriate exercises or therapy if needed. With conditioning, most back problems can be avoided. SEEK IMMEDIATE MEDICAL CARE IF:   You have numbness, tingling, weakness, or problems with the use of your arms or legs.  You experience severe back pain not relieved with medicines.  There is a change in bowel or bladder control.  You have increasing pain in any area of the body, including your belly (abdomen).  You notice shortness of breath, dizziness, or feel faint.  You feel sick to your stomach (nauseous), are throwing up (vomiting), or become sweaty.  You notice discoloration of your toes or legs, or your feet get very cold.  Your back pain is getting worse.  You have a fever. MAKE SURE YOU:   Understand these instructions.  Will watch your condition.  Will get help right away if you are not doing well or get worse. Document Released: 02/06/2005 Document Revised: 07/22/2011 Document Reviewed: 07/29/2008 [facility] Patient Information 2013 [place], [place].

## 2012-08-14 ENCOUNTER — Telehealth: Payer: Self-pay | Admitting: Family Medicine

## 2012-08-14 MED ORDER — HYDROCODONE-ACETAMINOPHEN 5-325 MG PO TABS: 1.0000 | ORAL_TABLET | Freq: Four times a day (QID) | ORAL | Status: DC | PRN

## 2012-08-14 NOTE — Telephone Encounter (Signed)
Change to Vicodin 5/325 to take q 6 hours prn pain, call in #[phone] with no rf

## 2012-08-14 NOTE — Telephone Encounter (Signed)
Pharm called. Need to change pt's RX HYDROcodone-acetaminophen (VICODIN) 5-500 MG per tablet to the 325mg . Thanks.

## 2013-12-02 ENCOUNTER — Ambulatory Visit (INDEPENDENT_AMBULATORY_CARE_PROVIDER_SITE_OTHER): Payer: BC Managed Care – PPO | Admitting: Family Medicine

## 2013-12-02 ENCOUNTER — Encounter: Payer: Self-pay | Admitting: Family Medicine

## 2013-12-02 VITALS — BP 110/80 | HR 88 | Temp 98.1°F | Wt 264.0 lb

## 2013-12-02 DIAGNOSIS — F3289 Other specified depressive episodes: Secondary | ICD-10-CM

## 2013-12-02 DIAGNOSIS — F329 Major depressive disorder, single episode, unspecified: Secondary | ICD-10-CM

## 2013-12-02 DIAGNOSIS — M940 Chondrocostal junction syndrome [Tietze]: Secondary | ICD-10-CM

## 2013-12-02 DIAGNOSIS — E039 Hypothyroidism, unspecified: Secondary | ICD-10-CM

## 2013-12-02 LAB — TSH: TSH: 2.03 u[IU]/mL (ref 0.35–4.50)

## 2013-12-02 MED ORDER — MELOXICAM 15 MG PO TABS: 15.0000 mg | ORAL_TABLET | Freq: Every day | ORAL | Status: DC

## 2013-12-02 NOTE — Assessment & Plan Note (Signed)
Multiple SSRI attempted per patient and could not tolerate. PHQ9 17. Gave patient information for [place] [facility] and strongly encouraged her to seek care.

## 2013-12-02 NOTE — Progress Notes (Signed)
  Tana ConchStephen Herron Fero, MD Phone: [phone]  Subjective:   Krystal Roberson M Summer is a 38 y.o. year old very pleasant female patient who presents with the following:  L rib pain X 2 days. Does not recall injury just started hurting when she woke up 2 days ago. Worse with twisting movements and rates 5/10 ache with this. Occasionally gets short few second sharp 8/10 pain. Ibuprofen with mild relief. Had something similar several years ago when diagnosed with costochondritis. Pain is not exertional and not relieved by rest.  ROS- no chest pain or shortness of breath or diaphoresis. She does also note some tension in her neck but admits to a great deal of stress recently. Pain L>R-this is just a low level ache lasting for last day and is not exertional or relieved by rest. Turning neck makes it worse.   Hypothyroidism On thyroid medication-leveothyroxine 175 mccg ROS-No hair or nail changes. No heat/cold intolerance. No constipation or diarrhea. Denies shakiness or anxiety. Gained 10 lb in 3 months. OCcasional palpitation though none recently (during time of rib pain). Lab Results  Component Value Date   TSH 7.51* 11/04/2011   Depression Untreated. Seen by psychiatry several years ago but could not tolerate any medication. PHQ9 of 17 today. She also feels more anxious and is wondering if this is related to too much thyroid medication.  ROS- no si/HI.   Past Medical History- history chest wall pain, hypothyroidism, depression, asthma  Medications- reviewed and updated Current Outpatient Prescriptions  Medication Sig Dispense Refill  . levothyroxine (SYNTHROID, LEVOTHROID) 175 MCG tablet Take 175 mcg by mouth daily before breakfast.       Objective: BP 110/80  Pulse 88  Temp(Src) 98.1 F (36.7 C) (Oral)  Wt 264 lb (119.75 kg)  LMP 11/26/2013 Gen: NAD, resting comfortably in chair, morbidly obese Neck: mild muscle spasm L>R CV: RRR no murmurs rubs or gallops Chest wall: pinpoint pain  underneath left breast along rib with some mild radiation Lungs: CTAB no crackles, wheeze, rhonchi Ext: trace edema Skin: warm, dry, no rash  Neuro: depressed mood   Assessment/Plan:  HYPOTHYROIDISM Hypothyroid on last check here but reports followed by endocrine since that time but not in last 6 months. Anxiety could be related to thyroid (patient states TSH low on last check at endocrine and not sure if she made adjustments). Check today.   DEPRESSION Multiple SSRI attempted per patient and could not tolerate. PHQ9 17. Gave patient information for [place] [facility] and strongly encouraged her to seek care.   Costochondritis Ice, mobic, return precautions given. Nonexertional and not relieved by rest so even with mild neck tension (which I believe is stress related) I have low concern for cardiac disease given no HTN, DM, smoking.   Orders Placed This Encounter  Procedures  . TSH    [place]   Meds ordered this encounter  Medications  . meloxicam (MOBIC) 15 MG tablet    Sig: Take 1 tablet (15 mg total) by mouth daily. Take for 10 days    Dispense:  30 tablet    Refill:  0

## 2013-12-02 NOTE — Progress Notes (Signed)
Pre visit review using our clinic review tool, if applicable. No additional management support is needed unless otherwise documented below in the visit note. 

## 2013-12-02 NOTE — Assessment & Plan Note (Signed)
Hypothyroid on last check here but reports followed by endocrine since that time but not in last 6 months. Anxiety could be related to thyroid (patient states TSH low on last check at endocrine and not sure if she made adjustments). Check today.

## 2013-12-02 NOTE — Patient Instructions (Signed)
Thyroid  Check levels today  Anxiety  May be related to thyroid though not sure  List of counselors given as you have had bad side effects with medications  L rib pain  Costochondritis-try ice or heat  Use mobic at least 10 days. Should help with neck tension  Call or return to clinic as needed if these symptoms worsen or fail to improve as anticipated or if new symptoms develop  I would advise a annual physical with Dr. Lysle MoralesFy Health Maintenance Due  Topic Date Due  . Pap Smear  04/25/1992  . Tetanus/tdap  04/25/1993

## 2013-12-22 ENCOUNTER — Ambulatory Visit (INDEPENDENT_AMBULATORY_CARE_PROVIDER_SITE_OTHER): Payer: BC Managed Care – PPO | Admitting: Family Medicine

## 2013-12-22 ENCOUNTER — Encounter: Payer: Self-pay | Admitting: Family Medicine

## 2013-12-22 VITALS — BP 124/65 | HR 84 | Temp 98.9°F | Ht 65.5 in | Wt 265.0 lb

## 2013-12-22 DIAGNOSIS — S838X9A Sprain of other specified parts of unspecified knee, initial encounter: Secondary | ICD-10-CM

## 2013-12-22 DIAGNOSIS — S86811A Strain of other muscle(s) and tendon(s) at lower leg level, right leg, initial encounter: Secondary | ICD-10-CM

## 2013-12-22 DIAGNOSIS — S86819A Strain of other muscle(s) and tendon(s) at lower leg level, unspecified leg, initial encounter: Secondary | ICD-10-CM

## 2013-12-22 NOTE — Progress Notes (Signed)
   Subjective:    Patient ID: Krystal Roberson, female    DOB: July 07, 1975, 38 y.o.   MRN: [id number]  HPI Here for pain in the right calf that started suddenly earlier this morning as she was walking down her sidewalk. As she turned to change direction she felt a sudden "pop" and felt a sharp pain in the calf. This pain has persisted today. No swelling.    Review of Systems  Constitutional: Negative.   Respiratory: Negative.   Cardiovascular: Negative.   Musculoskeletal: Positive for gait problem and myalgias.       Objective:   Physical Exam  Constitutional: She appears well-developed and well-nourished.  walking with a limp   Musculoskeletal:  Tender along the right calf, no edema, no cords felt. Denna HaggardHomans is negative           Assessment & Plan:  Partial gastrocnemius tear. Rest. Use ice today and tomorrow, and then switch to heat. Use Ibuprofen 600-800 mg several times a day. Recheck prn

## 2013-12-22 NOTE — Progress Notes (Signed)
Pre visit review using our clinic review tool, if applicable. No additional management support is needed unless otherwise documented below in the visit note. 

## 2014-03-13 HISTORY — PX: CHOLECYSTECTOMY: SHX55

## 2014-03-24 ENCOUNTER — Institutional Professional Consult (permissible substitution): Payer: BC Managed Care – PPO | Admitting: Cardiology

## 2014-03-24 ENCOUNTER — Emergency Department (HOSPITAL_COMMUNITY)
Admission: EM | Admit: 2014-03-24 | Discharge: 2014-03-24 | Disposition: A | Discharge status: Home / Self Care | Payer: BC Managed Care – PPO | Attending: Emergency Medicine | Admitting: Emergency Medicine

## 2014-03-24 ENCOUNTER — Encounter (HOSPITAL_COMMUNITY): Payer: Self-pay | Admitting: Emergency Medicine

## 2014-03-24 DIAGNOSIS — Z87440 Personal history of urinary (tract) infections: Secondary | ICD-10-CM | POA: Diagnosis not present

## 2014-03-24 DIAGNOSIS — Z880 Allergy status to penicillin: Secondary | ICD-10-CM | POA: Diagnosis not present

## 2014-03-24 DIAGNOSIS — Z8679 Personal history of other diseases of the circulatory system: Secondary | ICD-10-CM | POA: Diagnosis not present

## 2014-03-24 DIAGNOSIS — Z79899 Other long term (current) drug therapy: Secondary | ICD-10-CM | POA: Insufficient documentation

## 2014-03-24 DIAGNOSIS — E079 Disorder of thyroid, unspecified: Secondary | ICD-10-CM | POA: Diagnosis not present

## 2014-03-24 DIAGNOSIS — R109 Unspecified abdominal pain: Secondary | ICD-10-CM | POA: Diagnosis not present

## 2014-03-24 DIAGNOSIS — Z791 Long term (current) use of non-steroidal anti-inflammatories (NSAID): Secondary | ICD-10-CM | POA: Insufficient documentation

## 2014-03-24 DIAGNOSIS — J45909 Unspecified asthma, uncomplicated: Secondary | ICD-10-CM | POA: Diagnosis not present

## 2014-03-24 DIAGNOSIS — M7989 Other specified soft tissue disorders: Secondary | ICD-10-CM | POA: Diagnosis not present

## 2014-03-24 DIAGNOSIS — R200 Anesthesia of skin: Secondary | ICD-10-CM | POA: Diagnosis present

## 2014-03-24 DIAGNOSIS — M549 Dorsalgia, unspecified: Secondary | ICD-10-CM | POA: Insufficient documentation

## 2014-03-24 DIAGNOSIS — E663 Overweight: Secondary | ICD-10-CM | POA: Diagnosis not present

## 2014-03-24 DIAGNOSIS — Z8659 Personal history of other mental and behavioral disorders: Secondary | ICD-10-CM | POA: Insufficient documentation

## 2014-03-24 DIAGNOSIS — R609 Edema, unspecified: Secondary | ICD-10-CM

## 2014-03-24 LAB — TSH: TSH: 4 u[IU]/mL (ref 0.350–4.500)

## 2014-03-24 MED ORDER — IBUPROFEN 200 MG PO TABS
400.0000 mg | ORAL_TABLET | Freq: Once | ORAL | Status: AC
  Administered 2014-03-24: 400 mg via ORAL
  Filled 2014-03-24: qty 2

## 2014-03-24 NOTE — ED Notes (Signed)
Patient transported to Ultrasound 

## 2014-03-24 NOTE — Progress Notes (Signed)
VASCULAR LAB PRELIMINARY  PRELIMINARY  PRELIMINARY  PRELIMINARY  Bilateral lower extremity venous duplex completed.   Study was technically limited due to poor patient cooperation; patient was unable to tolerate compression maneuvers. There is no obvious evidence of deep vein thrombosis involving bilateral lower extremities.  Preliminary results discussed with Dr.Wentz.  Thereasa ParkinHelene Jarrius [place], RVT for [employer] Inc[employer]Michelle Simonetti, RVT, RDCS, RDMS 03/24/2014

## 2014-03-24 NOTE — ED Provider Notes (Signed)
CSN: [id number]     Arrival date & time 03/24/14  0848 History   First MD Initiated Contact with Patient 03/24/14 0902     Chief Complaint  Patient presents with  . Abdominal Pain  . Pressure Behind the Eyes  . Back Pain  . Numbness     (Consider location/radiation/quality/duration/timing/severity/associated sxs/prior Treatment) HPI   Krystal Roberson is a 38 y.o. female who presents for evaluation of multiple problems. She was driving to work today, when she noticed a pressure sensation behind her left eye.  There is no associated sinus symptoms.  She also has chronic ongoing chest tightness for which she scheduled an appointment with a cardiologist.  She was going to be seen today by cardiology, but that appointment was changed due to an emergency of her physician's. She has also noted bilateral leg swelling for several days and left leg tenderness.  Additionally, she has cramps in her left foot that are periodic and don't occur with any specific provocation.  She denies fever, chills, shortness of breath, cough, dizziness, weakness.  She states that her Endocrinologist is trying to keep her thyroid supplementation medication "high".  She has not had a TSH checked recently. There are no other known modifying factors.   Past Medical History  Diagnosis Date  . Depression   . Thyroid disease   . Migraine   . Allergy   . UTI (lower urinary tract infection)   . Asthma   . [id number](478.2Headache(784.0)    Past Surgical History  Procedure Laterality Date  . Cesarean section     Family History  Problem Relation Age of Onset  . Arthritis Other   . Cancer Other     breast, 1st degree, less 50  . Diabetes Other     1st degree   . Hypertension Other   . Depression Other   . Stroke Other     1st degree, less 50  . Coronary artery disease Other    History  Substance Use Topics  . Smoking status: Never Smoker   . Smokeless tobacco: Never Used  . Alcohol Use: Yes     Comment: rare   OB  History    No data available     Review of Systems  All other systems reviewed and are negative.     Allergies  Penicillins  Home Medications   Prior to Admission medications   Medication Sig Start Date End Date Taking? Authorizing Provider  levothyroxine (SYNTHROID, LEVOTHROID) 175 MCG tablet Take 175 mcg by mouth daily before breakfast.    Historical Provider, MD  meloxicam (MOBIC) 15 MG tablet Take 1 tablet (15 mg total) by mouth daily. Take for 10 days 12/02/13   Shelva MajesticStephen O Hunter, MD   BP 124/64 mmHg  Pulse 96  Temp(Src) 98.8 F (37.1 C) (Oral)  Resp 18  SpO2 98%  LMP 03/20/2014 Physical Exam  Constitutional: She is oriented to person, place, and time. She appears well-developed. No distress.  She has overweight  HENT:  Head: Normocephalic and atraumatic.  Right Ear: External ear normal.  Left Ear: External ear normal.  Eyes: Conjunctivae and EOM are normal. Pupils are equal, round, and reactive to light.  Neck: Normal range of motion and phonation normal. Neck supple.  There is no palpable thyromegaly, or tenderness in the thyroid region.  Cardiovascular: Normal rate, regular rhythm and normal heart sounds.   Pulmonary/Chest: Effort normal and breath sounds normal. She exhibits no bony tenderness.  Abdominal: Soft. There is  no tenderness.  Musculoskeletal: Normal range of motion.  Bilateral lower extremity swelling with tenderness of the left medial calf.  There is no clear asymmetry of the lower legs.  Neurological: She is alert and oriented to person, place, and time. No cranial nerve deficit or sensory deficit. She exhibits normal muscle tone. Coordination normal.  Skin: Skin is warm, dry and intact.  Psychiatric: She has a normal mood and affect. Her behavior is normal. Judgment and thought content normal.  Nursing note and vitals reviewed.   ED Course  Procedures (including critical care time) Medications  ibuprofen (ADVIL,MOTRIN) tablet 400 mg (400 mg Oral  Given 03/24/14 1017)    Patient Vitals for the past 24 hrs:  BP Temp Temp src Pulse Resp SpO2  03/24/14 1500 122/72 mmHg - - 88 18 97 %  03/24/14 1445 115/71 mmHg - - 86 15 95 %  03/24/14 1230 104/62 mmHg - - 87 - 97 %  03/24/14 1130 (!) 115/48 mmHg - - 82 - 97 %  03/24/14 1030 111/64 mmHg - - 88 - 99 %  03/24/14 1000 108/76 mmHg - - 87 - 97 %  03/24/14 0930 (!) 112/53 mmHg - - 92 - 97 %  03/24/14 0908 124/64 mmHg 98.8 F (37.1 C) Oral 96 18 98 %    15:05- verbal report from vascular technician that the patient does not have a DVT in either leg.  3:24 PM Reevaluation with update and discussion. After initial assessment and treatment, an updated evaluation reveals no further c/o. Marland Kitchen. Arvella Massingale L     Labs Review Labs Reviewed  TSH    Imaging Review No results found.   EKG Interpretation None      MDM   Final diagnoses:  Leg swelling  Thyroid disease    Nonspecific multiple somatic complaints, with nonspecific physical findings.  Evaluation today for secondary hyperthyroidism by screening TSH testing.  Leg discomfort evaluated with Doppler imaging.  Nursing Notes Reviewed/ Care Coordinated Applicable Imaging Reviewed Interpretation of Laboratory Data incorporated into ED treatment  The patient appears reasonably screened and/or stabilized for discharge and I doubt any other medical condition or other [facility] requiring further screening, evaluation, or treatment in the ED at this time prior to discharge.  Plan: Home Medications- usual; Home Treatments- rest; return here if the recommended treatment, does not improve the symptoms; Recommended follow up- PCP prn  Flint MelterElliott L Skyelyn Scruggs, MD 03/24/14 [id number]

## 2014-03-24 NOTE — ED Notes (Addendum)
Pt reports she has intermittant chest pain at times and has seen cardiologist and he says anxiety and referred for counseling. States he thyroid meds make her feel tightness in chest. Not constant; worse with movement. Not experiencing that currently. (denies chest pain)

## 2014-03-24 NOTE — ED Notes (Signed)
PT's pain improved; Reported facial numbness again; no droop noted; MD aware.

## 2014-03-24 NOTE — ED Notes (Signed)
MD at bedside. 

## 2014-03-24 NOTE — ED Notes (Signed)
doppler called to find out about delay; pt updated.

## 2014-03-24 NOTE — ED Notes (Signed)
Pt reports multiple complaints; belly pain and tenderness; leg cramps; eye pressure; facial numbness. Took leftover back pain percocets for her pain. Helped her sleep but this morning woke up with pain again.

## 2014-03-24 NOTE — Discharge Instructions (Signed)
Hypothyroidism The thyroid is a large gland located in the lower front of your neck. The thyroid gland helps control metabolism. Metabolism is how your body handles food. It controls metabolism with the hormone thyroxine. When this gland is underactive (hypothyroid), it produces too little hormone.  CAUSES These include:   Absence or destruction of thyroid tissue.  Goiter due to iodine deficiency.  Goiter due to medications.  Congenital defects (since birth).  Problems with the pituitary. This causes a lack of TSH (thyroid stimulating hormone). This hormone tells the thyroid to turn out more hormone. SYMPTOMS  Lethargy (feeling as though you have no energy)  Cold intolerance  Weight gain (in spite of normal food intake)  Dry skin  Coarse hair  Menstrual irregularity (if severe, may lead to infertility)  Slowing of thought processes Cardiac problems are also caused by insufficient amounts of thyroid hormone. Hypothyroidism in the newborn is cretinism, and is an extreme form. It is important that this form be treated adequately and immediately or it will lead rapidly to retarded physical and mental development. DIAGNOSIS  To prove hypothyroidism, your caregiver may do blood tests and ultrasound tests. Sometimes the signs are hidden. It may be necessary for your caregiver to watch this illness with blood tests either before or after diagnosis and treatment. TREATMENT  Low levels of thyroid hormone are increased by using synthetic thyroid hormone. This is a safe, effective treatment. It usually takes about four weeks to gain the full effects of the medication. After you have the full effect of the medication, it will generally take another four weeks for problems to leave. Your caregiver may start you on low doses. If you have had heart problems the dose may be gradually increased. It is generally not an emergency to get rapidly to normal. HOME CARE INSTRUCTIONS   Take your  medications as your caregiver suggests. Let your caregiver know of any medications you are taking or start taking. Your caregiver will help you with dosage schedules.  As your condition improves, your dosage needs may increase. It will be necessary to have continuing blood tests as suggested by your caregiver.  Report all suspected medication side effects to your caregiver. SEEK MEDICAL CARE IF: Seek medical care if you develop:  Sweating.  Tremulousness (tremors).  Anxiety.  Rapid weight loss.  Heat intolerance.  Emotional swings.  Diarrhea.  Weakness. SEEK IMMEDIATE MEDICAL CARE IF:  You develop chest pain, an irregular heart beat (palpitations), or a rapid heart beat. MAKE SURE YOU:   Understand these instructions.  Will watch your condition.  Will get help right away if you are not doing well or get worse. Document Released: 04/29/2005 Document Revised: 07/22/2011 Document Reviewed: 12/18/2007 Exit[employer] Patient Information 2015 [employer]. This information is not intended to replace advice given to you by your health care provider. Make sure you discuss any questions you have with your health care provider.  

## 2014-03-26 ENCOUNTER — Ambulatory Visit (INDEPENDENT_AMBULATORY_CARE_PROVIDER_SITE_OTHER): Payer: BC Managed Care – PPO | Admitting: Family Medicine

## 2014-03-26 ENCOUNTER — Encounter: Payer: Self-pay | Admitting: Family Medicine

## 2014-03-26 VITALS — BP 120/88 | Temp 98.3°F | Wt 263.0 lb

## 2014-03-26 DIAGNOSIS — R109 Unspecified abdominal pain: Secondary | ICD-10-CM

## 2014-03-26 NOTE — Patient Instructions (Signed)
Schedule a follow up with your PCP.  In meantime, use a stool softener (colace 100mg  a day) and drink plenty of fluids.  Take your BP cuff in for calibration.   Take care. Glad to see you.

## 2014-03-26 NOTE — Assessment & Plan Note (Signed)
Benign exam, no rebound.  Reassured. Unclear how much of her sx are from constipation.  Okay for outpatient f/u.  Her BP is fine now and has a normal CV exam.  No clear source for the L eye sx.  At this point, would have her f/u with PCP.  In meantime, use a stool softener and drink plenty of fluids.  Will take her BP cuff to PCP for calibration.   Some of her sx may resolve after a BM.   She agrees.

## 2014-03-26 NOTE — Progress Notes (Signed)
Seen at ER recently for L eye pain and leg swelling.  She had neg dopplers.  The L eye pain was better yesterday, then returned today.  More noted if laying down.  No vision loss. No FCVD.  Some occ nausea, not constant.   No blood in stools.  Last BM was a few days ago, but that isn't uncommon for patient.   Occ lightheaded with standing.  No syncope.    She has had RLQ for the last 5-6 days.  Still with some discomfort.  Area is ttp per patient.  Better with resting, worse at the day goes one.  Overall the pain is variable, no constant.   BP has been variable occ with low SBP, up to 180 systolic.   She has a cuff at home, but didn't bring it today.    Meds, vitals, and allergies reviewed.   ROS: See HPI.  Otherwise, noncontributory.  GEN: nad, alert and oriented HEENT: mucous membranes moist, tm wnl nasal and OP exam wnl NECK: supple w/o LA CV: rrr.  PULM: ctab, no inc wob ABD: soft, +bs, minimally ttp in the RLQ and LUQ, no mass appreciated.  EXT: no edema SKIN: no acute rash CN 2-12 wnl B, S/S/DTR wnl x4 EOMI, PERRL, fundus wnl B on limited exam.

## 2014-04-04 ENCOUNTER — Institutional Professional Consult (permissible substitution): Payer: BC Managed Care – PPO | Admitting: Cardiovascular Disease

## 2014-06-14 ENCOUNTER — Ambulatory Visit (INDEPENDENT_AMBULATORY_CARE_PROVIDER_SITE_OTHER): Payer: BLUE CROSS/BLUE SHIELD | Admitting: Family Medicine

## 2014-06-14 ENCOUNTER — Encounter: Payer: Self-pay | Admitting: Family Medicine

## 2014-06-14 VITALS — BP 122/77 | HR 96 | Temp 98.4°F | Ht 65.5 in | Wt 259.0 lb

## 2014-06-14 DIAGNOSIS — S29019A Strain of muscle and tendon of unspecified wall of thorax, initial encounter: Secondary | ICD-10-CM

## 2014-06-14 DIAGNOSIS — S29009A Unspecified injury of muscle and tendon of unspecified wall of thorax, initial encounter: Secondary | ICD-10-CM

## 2014-06-14 MED ORDER — HYDROCODONE-ACETAMINOPHEN 10-325 MG PO TABS: 1.0000 | ORAL_TABLET | Freq: Three times a day (TID) | ORAL | Status: DC | PRN

## 2014-06-14 NOTE — Progress Notes (Signed)
Pre visit review using our clinic review tool, if applicable. No additional management support is needed unless otherwise documented below in the visit note. 

## 2014-06-14 NOTE — Progress Notes (Signed)
   Subjective:    Patient ID: Rica Motendrea M Gauss, female    DOB: [birth year], 39 y.o.   MRN: [id number]  HPI Here for injuries related to an accident at work on 06-10-14. 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 She has been taking Advil and her back has been getting much better. Now the back has very little pain at all, but the right side pain persists. No SOB. Using Advil during the day time and this helps a lot. However it is hard to sleep at night due to the side pain.    Review of Systems  Constitutional: Negative.   Respiratory: Negative.   Cardiovascular: Negative.        Objective:   Physical Exam  Constitutional: She appears well-developed and well-nourished. No distress.  Cardiovascular: Normal rate, regular rhythm, normal heart sounds and intact distal pulses.   Pulmonary/Chest: Effort normal and breath sounds normal. No respiratory distress. She has no wheezes. She has no rales.  Tender along the right ribs at about the 9th or 10th ribs. No crepitus   Musculoskeletal:  Her back is not tender and has full ROM           Assessment & Plan:  Her back seems to have healed but she has also strained an intercostal muscle on the right side. This should heal with time over the next week or two. Use Advil in the daytime and try Norco at night. Use moist heat. Recheck prn

## 2014-07-13 ENCOUNTER — Ambulatory Visit (INDEPENDENT_AMBULATORY_CARE_PROVIDER_SITE_OTHER): Payer: BLUE CROSS/BLUE SHIELD | Admitting: Family Medicine

## 2014-07-13 ENCOUNTER — Encounter: Payer: Self-pay | Admitting: Family Medicine

## 2014-07-13 VITALS — BP 112/71 | HR 80 | Temp 98.5°F | Ht 65.5 in | Wt 263.0 lb

## 2014-07-13 DIAGNOSIS — S39012D Strain of muscle, fascia and tendon of lower back, subsequent encounter: Secondary | ICD-10-CM

## 2014-07-13 DIAGNOSIS — E038 Other specified hypothyroidism: Secondary | ICD-10-CM

## 2014-07-13 LAB — CBC WITH DIFFERENTIAL/PLATELET
BASOS ABS: 0.1 10*3/uL (ref 0.0–0.1)
Basophils Relative: 0.6 % (ref 0.0–3.0)
Eosinophils Absolute: 0.3 10*3/uL (ref 0.0–0.7)
Eosinophils Relative: 3.3 % (ref 0.0–5.0)
HCT: 33.1 % — ABNORMAL LOW (ref 36.0–46.0)
Hemoglobin: 10.8 g/dL — ABNORMAL LOW (ref 12.0–15.0)
Lymphocytes Relative: 27.2 % (ref 12.0–46.0)
Lymphs Abs: 2.4 10*3/uL (ref 0.7–4.0)
MCHC: 32.7 g/dL (ref 30.0–36.0)
MCV: 79.8 fl (ref 78.0–100.0)
MONO ABS: 0.5 10*3/uL (ref 0.1–1.0)
Monocytes Relative: 5.4 % (ref 3.0–12.0)
Neutro Abs: 5.6 10*3/uL (ref 1.4–7.7)
Neutrophils Relative %: 63.5 % (ref 43.0–77.0)
PLATELETS: 317 10*3/uL (ref 150.0–400.0)
RBC: 4.15 Mil/uL (ref 3.87–5.11)
RDW: 16.9 % — ABNORMAL HIGH (ref 11.5–15.5)
WBC: 8.8 10*3/uL (ref 4.0–10.5)

## 2014-07-13 LAB — BASIC METABOLIC PANEL
BUN: 15 mg/dL (ref 6–23)
CO2: 28 meq/L (ref 19–32)
Calcium: 9.3 mg/dL (ref 8.4–10.5)
Chloride: 103 mEq/L (ref 96–112)
Creatinine, Ser: 0.76 mg/dL (ref 0.40–1.20)
GFR: 89.49 mL/min (ref >60.00)
Glucose, Bld: 94 mg/dL (ref 70–99)
Potassium: 3.9 mEq/L (ref 3.5–5.1)
Sodium: 137 mEq/L (ref 135–145)

## 2014-07-13 LAB — TSH: TSH: 9.37 u[IU]/mL — ABNORMAL HIGH (ref 0.35–4.50)

## 2014-07-13 MED ORDER — LEVOTHYROXINE SODIUM 175 MCG PO TABS: 175.0000 ug | ORAL_TABLET | Freq: Every day | ORAL | Status: DC

## 2014-07-13 NOTE — Progress Notes (Signed)
   Subjective:    Patient ID: Krystal Roberson, female    DOB: 1975-11-24, 39 y.o.   MRN: [id number]  HPI Here to recheck her thyroid status. She was here recently for a muscle strain in the trunk and back, and this has resolved. She feels well.    Review of Systems  Constitutional: Negative.   Respiratory: Negative.   Cardiovascular: Negative.   Endocrine: Negative.        Objective:   Physical Exam  Constitutional: She appears well-developed and well-nourished.  Neck: No thyromegaly present.  Cardiovascular: Normal rate, regular rhythm, normal heart sounds and intact distal pulses.   Pulmonary/Chest: Effort normal and breath sounds normal.  Lymphadenopathy:    She has no cervical adenopathy.          Assessment & Plan:  Check a TSH today. We talked about ways for her to lose weight, and I suggested she look into Weight Watchers.

## 2014-07-13 NOTE — Progress Notes (Signed)
Pre visit review using our clinic review tool, if applicable. No additional management support is needed unless otherwise documented below in the visit note. 

## 2014-07-19 MED ORDER — LEVOTHYROXINE SODIUM 200 MCG PO TABS: 200.0000 ug | ORAL_TABLET | Freq: Every day | ORAL | Status: DC

## 2014-07-19 NOTE — Addendum Note (Signed)
Addended by: Aniceto BossNIMMONS, SYLVIA A on: 07/19/2014 11:55 AM   Modules accepted: Orders, Medications

## 2014-07-27 ENCOUNTER — Encounter: Payer: Self-pay | Admitting: Family Medicine

## 2014-07-27 NOTE — Telephone Encounter (Signed)
I spoke with pt  

## 2014-12-05 ENCOUNTER — Encounter: Payer: Self-pay | Admitting: Family Medicine

## 2014-12-05 ENCOUNTER — Ambulatory Visit (INDEPENDENT_AMBULATORY_CARE_PROVIDER_SITE_OTHER): Payer: BLUE CROSS/BLUE SHIELD | Admitting: Family Medicine

## 2014-12-05 VITALS — BP 126/78 | HR 86 | Temp 98.6°F | Resp 16 | Wt 266.0 lb

## 2014-12-05 DIAGNOSIS — E038 Other specified hypothyroidism: Secondary | ICD-10-CM

## 2014-12-05 DIAGNOSIS — R071 Chest pain on breathing: Secondary | ICD-10-CM | POA: Diagnosis not present

## 2014-12-05 DIAGNOSIS — R0789 Other chest pain: Secondary | ICD-10-CM

## 2014-12-05 LAB — T4, FREE: Free T4: 1.09 ng/dL (ref 0.60–1.60)

## 2014-12-05 LAB — TSH: TSH: 2.69 u[IU]/mL (ref 0.35–4.50)

## 2014-12-05 LAB — T3, FREE: T3, Free: 3.2 pg/mL (ref 2.3–4.2)

## 2014-12-05 NOTE — Progress Notes (Signed)
   Subjective:    Patient ID: Krystal Roberson, female    DOB: [birth year], 39 y.o.   MRN: [id number]  HPI Here for a week of chest tightness and mild sharp pains. She can make these worse by taking a deep breath or by stretching her arms out wide. This is not affected by exertion. No SOB or nausea or sweats. Of note about 4 months ago we increased the dose of her Synthroid.    Review of Systems  Constitutional: Negative.   Respiratory: Negative.   Cardiovascular: Positive for chest pain. Negative for palpitations and leg swelling.  Endocrine: Negative.        Objective:   Physical Exam  Constitutional: She is oriented to person, place, and time. She appears well-developed and well-nourished. No distress.  Neck: No thyromegaly present.  Cardiovascular: Normal rate, regular rhythm, normal heart sounds and intact distal pulses.   EKG normal   Pulmonary/Chest: Effort normal and breath sounds normal. No respiratory distress. She has no wheezes. She has no rales.  She is tender along the left sternal border   Lymphadenopathy:    She has no cervical adenopathy.  Neurological: She is alert and oriented to person, place, and time.          Assessment & Plan:  This is costochondritis, similar to the episode she had in 2009. She may try Ibuprofen as needed. This should resolve on its own.

## 2015-02-13 ENCOUNTER — Encounter: Payer: Self-pay | Admitting: Family Medicine

## 2015-02-13 ENCOUNTER — Ambulatory Visit (INDEPENDENT_AMBULATORY_CARE_PROVIDER_SITE_OTHER): Payer: BLUE CROSS/BLUE SHIELD | Admitting: Family Medicine

## 2015-02-13 VITALS — BP 129/64 | HR 92 | Temp 98.7°F | Ht 65.5 in | Wt 269.0 lb

## 2015-02-13 DIAGNOSIS — S93402A Sprain of unspecified ligament of left ankle, initial encounter: Secondary | ICD-10-CM | POA: Diagnosis not present

## 2015-02-13 DIAGNOSIS — S93602A Unspecified sprain of left foot, initial encounter: Secondary | ICD-10-CM

## 2015-02-13 NOTE — Progress Notes (Signed)
Pre visit review using our clinic review tool, if applicable. No additional management support is needed unless otherwise documented below in the visit note. 

## 2015-02-14 ENCOUNTER — Encounter: Payer: Self-pay | Admitting: Family Medicine

## 2015-02-14 NOTE — Progress Notes (Signed)
   Subjective:    Patient ID: Krystal Roberson, female    DOB: 08-24-1975, 39 y.o.   MRN: [id number]  HPI Here for injuries that occurred on 01-23-15 when she stepped into a hol ein the parking lot of a [employer]. She felt her left ankle twist and it was swollen and painful, but this as been steadily improving. However she also had swelling and pain over the forefoot and tis has not improved at all. She has not used a brace od any sort but she did apply ice after this happened.    Review of Systems  Constitutional: Negative.   Musculoskeletal: Positive for joint swelling and arthralgias.       Objective:   Physical Exam  Constitutional: She appears well-developed and well-nourished.  Limping with pain   Musculoskeletal:  The left medial and lateral ankles are slightly swollen but not red or warm. These are mildly tender but ROM is full. The dorsal forefoot is swollen and very tender over the 3rd and 4th metatarsals. No crepitus           Assessment & Plan:  She has had a mild ankle sprain and a possible metatarsal fracture. She is to wear supportive shoes like athletic sneakers. We will arrange for her to see Orthopedics ASAP

## 2015-03-10 ENCOUNTER — Ambulatory Visit (INDEPENDENT_AMBULATORY_CARE_PROVIDER_SITE_OTHER): Payer: BLUE CROSS/BLUE SHIELD | Admitting: Family Medicine

## 2015-03-10 ENCOUNTER — Encounter: Payer: Self-pay | Admitting: Family Medicine

## 2015-03-10 VITALS — BP 138/76 | HR 97 | Temp 98.7°F | Ht 65.5 in | Wt 271.0 lb

## 2015-03-10 DIAGNOSIS — M25562 Pain in left knee: Secondary | ICD-10-CM | POA: Diagnosis not present

## 2015-03-10 DIAGNOSIS — R002 Palpitations: Secondary | ICD-10-CM | POA: Diagnosis not present

## 2015-03-10 DIAGNOSIS — F411 Generalized anxiety disorder: Secondary | ICD-10-CM | POA: Insufficient documentation

## 2015-03-10 MED ORDER — HYDROCODONE-ACETAMINOPHEN 10-325 MG PO TABS: 1.0000 | ORAL_TABLET | Freq: Three times a day (TID) | ORAL | Status: DC | PRN

## 2015-03-10 MED ORDER — ONDANSETRON HCL 8 MG PO TABS: 8.0000 mg | ORAL_TABLET | Freq: Three times a day (TID) | ORAL | Status: DC | PRN

## 2015-03-10 MED ORDER — METOPROLOL SUCCINATE ER 50 MG PO TB24: 50.0000 mg | ORAL_TABLET | Freq: Every day | ORAL | Status: DC

## 2015-03-10 NOTE — Progress Notes (Signed)
   Subjective:    Patient ID: Krystal Roberson, female    DOB: 1975/05/31, 39 y.o.   MRN: [id number]  HPI Here for several things. First she has some soreness in the left lower leg she asks me to check. She had been wearing an Aircast splint on the left ankle for a sprain, and she thinks the leg is sore where the top of the splint was rubbing. Also se describes 2 weeks of 2-3 episodes a day of pounding or racing in the chest, with some mild SOB. These spells are brief, lasting less than 10 minutes each. No chest pain. These are not associated with exertion. She thinks they may be related to stress, as she has been dealing with a lot more stress than usual both at home and at the job. She had normal labs in March and an EKG in July showing sinus rhythm but which also showed frequent PVCs.    Review of Systems  Constitutional: Negative.   Respiratory: Positive for shortness of breath. Negative for cough, choking, chest tightness and wheezing.   Cardiovascular: Positive for palpitations. Negative for chest pain and leg swelling.  Gastrointestinal: Negative.   Neurological: Negative.   Psychiatric/Behavioral: Negative for hallucinations, confusion, dysphoric mood and decreased concentration. The patient is nervous/anxious.        Objective:   Physical Exam  Constitutional: She appears well-developed and well-nourished. No distress.  Neck: No thyromegaly present.  Cardiovascular: Normal heart sounds and intact distal pulses.   No murmur heard. Rapid rate with an irregular rhythm   Pulmonary/Chest: Effort normal and breath sounds normal. No respiratory distress. She has no wheezes. She has no rales.  Musculoskeletal:  The lower right leg is mildly tender along the lateral side but not on the calf. No swelling, no cords felt.   Lymphadenopathy:    She has no cervical adenopathy.  Psychiatric: She has a normal mood and affect. Her behavior is normal. Thought content normal.            Assessment & Plan:  Her leg tenderness is the result of wearing the splint, this should resolve in the next several days. Her palpitations appear to be the result of anxiety, but we will treat the symptoms only right now rather than the anxiety. She is not interested in taking anxiety meds at this time. Try low dose metoprolol. Recheck in 2-3 weeks

## 2015-03-10 NOTE — Progress Notes (Signed)
Pre visit review using our clinic review tool, if applicable. No additional management support is needed unless otherwise documented below in the visit note. 

## 2015-03-22 ENCOUNTER — Ambulatory Visit (INDEPENDENT_AMBULATORY_CARE_PROVIDER_SITE_OTHER): Payer: BLUE CROSS/BLUE SHIELD | Admitting: Cardiovascular Disease

## 2015-03-22 ENCOUNTER — Encounter: Payer: Self-pay | Admitting: Cardiovascular Disease

## 2015-03-22 VITALS — BP 118/78 | HR 101 | Ht 66.0 in | Wt 273.0 lb

## 2015-03-22 DIAGNOSIS — R0602 Shortness of breath: Secondary | ICD-10-CM | POA: Diagnosis not present

## 2015-03-22 DIAGNOSIS — G4733 Obstructive sleep apnea (adult) (pediatric): Secondary | ICD-10-CM | POA: Diagnosis not present

## 2015-03-22 DIAGNOSIS — Z7182 Exercise counseling: Secondary | ICD-10-CM

## 2015-03-22 DIAGNOSIS — R000 Tachycardia, unspecified: Secondary | ICD-10-CM | POA: Diagnosis not present

## 2015-03-22 DIAGNOSIS — R002 Palpitations: Secondary | ICD-10-CM | POA: Diagnosis not present

## 2015-03-22 DIAGNOSIS — Z7189 Other specified counseling: Secondary | ICD-10-CM

## 2015-03-22 MED ORDER — METOPROLOL TARTRATE 25 MG PO TABS: 12.5000 mg | ORAL_TABLET | Freq: Two times a day (BID) | ORAL | Status: DC

## 2015-03-22 NOTE — Patient Instructions (Addendum)
Your physician has requested that you have an echocardiogram. Echocardiography is a painless test that uses sound waves to create images of your heart. It provides your doctor with information about the size and shape of your heart and how well your heart's chambers and valves are working. This procedure takes approximately one hour. There are no restrictions for this procedure. Labs for BMET, Magnesium - orders given today. Your physician has recommended that you have a sleep study. This test records several body functions during sleep, including: brain activity, eye movement, oxygen and carbon dioxide blood levels, heart rate and rhythm, breathing rate and rhythm, the flow of air through your mouth and nose, snoring, body muscle movements, and chest and belly movement. Office will contact with results via phone or letter.   Stop the Toprol XL. Begin Lopressor (Metoprololo tart) 12.5mg  twice a day  - new sent to [facility] today. Continue all other medications.   Follow up in  2 months.

## 2015-03-22 NOTE — Progress Notes (Signed)
Patient ID: Krystal Roberson, female   DOB: 02-Aug-1975, 39 y.o.   MRN: [id number]        CARDIOLOGY CONSULT NOTE  Patient ID: Krystal Roberson MRN: [id number] DOB/AGE: 12-05-1975 39 y.o.  Admit date: (Not on file) Primary Physician Nelwyn Salisbury, MD  Reason for Consultation: palpitations, HTN  HPI: The patient is a 39 year old woman who has been referred for the evaluation of palpitations. ECG performed on 12/05/14 showed sinus rhythm with frequent PVCs.  She has hypothyroidism with a normal TSH on 12/05/14. She is obese.  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 She does not exercise. She says she cannot get a deep, full breath. She occasionally has mild ankle and dorsal feet swelling. She denies exertional chest pain. She also feels forceful, hard palpitations. Her husband says that she snores a lot and stops breathing at night. She was prescribed Toprol-XL 50 mg daily but has not begun taking it yet.   ECG performed in the office today demonstrates sinus rhythm/sinus tachycardia, heart rate 100 bpm.    Allergies  Allergen Reactions  . Penicillins Hives    Current Outpatient Prescriptions  Medication Sig Dispense Refill  . Aspirin-Acetaminophen-Caffeine (EXCEDRIN PO) Take 2 tablets by mouth every 6 (six) hours as needed (for headache).    Marland Kitchen ibuprofen (ADVIL,MOTRIN) 200 MG tablet Take 200 mg by mouth every 6 (six) hours as needed.    Marland Kitchen levothyroxine (SYNTHROID) 200 MCG tablet Take 1 tablet (200 mcg total) by mouth daily before breakfast. 90 tablet 3  . metoprolol succinate (TOPROL-XL) 50 MG 24 hr tablet Take 1 tablet (50 mg total) by mouth daily. Take with or immediately following a meal. 30 tablet 3   No current facility-administered medications for this visit.    Past Medical History  Diagnosis Date  . Depression   . Thyroid disease   . Migraine   . Allergy   . UTI (lower urinary tract infection)   . Asthma   . NWGNFAOZ(308.6)     Past Surgical  History  Procedure Laterality Date  . Cesarean section      Social History   Social History  . Marital Status: Married    Spouse Name: N/A  . Number of Children: N/A  . Years of Education: N/A   Occupational History  . Not on file.   Social History Main Topics  . Smoking status: Never Smoker   . Smokeless tobacco: Never Used  . Alcohol Use: No  . Drug Use: No  . Sexual Activity: Not on file   Other Topics Concern  . Not on file   Social History Narrative     No family history of premature CAD in 1st degree relatives.  Prior to Admission medications   Medication Sig Start Date End Date Taking? Authorizing Provider  Aspirin-Acetaminophen-Caffeine (EXCEDRIN PO) Take 2 tablets by mouth every 6 (six) hours as needed (for headache).    Historical Provider, MD  ibuprofen (ADVIL,MOTRIN) 200 MG tablet Take 200 mg by mouth every 6 (six) hours as needed.    Historical Provider, MD  levothyroxine (SYNTHROID) 200 MCG tablet Take 1 tablet (200 mcg total) by mouth daily before breakfast. 07/19/14   Nelwyn Salisbury, MD  metoprolol succinate (TOPROL-XL) 50 MG 24 hr tablet Take 1 tablet (50 mg total) by mouth daily. Take with or immediately following a meal. 03/10/15   Nelwyn Salisbury, MD     Review of systems complete and found to be  negative unless listed above in HPI     Physical exam Height 5\' 6"  (1.676 m), weight 273 lb (123.832 kg). General: NAD Neck: No JVD, no thyromegaly or thyroid nodule.  Lungs: Clear to auscultation bilaterally with normal respiratory effort. CV: Nondisplaced PMI. HR at upper normal limits, regular rhythm, normal S1/S2, no S3/S4, no murmur.  No peripheral edema.  No carotid bruit.   Abdomen: Soft, obese.  Skin: Intact without lesions or rashes.  Neurologic: Alert and oriented x 3.  Psych: Normal affect. Extremities: No clubbing or cyanosis.  HEENT: Normal.   ECG: Most recent ECG reviewed.  Labs:   Lab Results  Component Value Date   WBC 8.8  07/13/2014   HGB 10.8* 07/13/2014   HCT 33.1* 07/13/2014   MCV 79.8 07/13/2014   PLT 317.0 07/13/2014   No results for input(s): NA, K, CL, CO2, BUN, CREATININE, CALCIUM, PROT, BILITOT, ALKPHOS, ALT, AST, GLUCOSE in the last 168 hours.  Invalid input(s): LABALBU No results found for: CKTOTAL, CKMB, CKMBINDEX, TROPONINI  Lab Results  Component Value Date   CHOL 144 11/04/2011   CHOL 159 01/31/2009   Lab Results  Component Value Date   HDL 36.20* 11/04/2011   HDL 35.20* 01/31/2009   Lab Results  Component Value Date   LDLCALC 82 11/04/2011   LDLCALC 105* 01/31/2009   Lab Results  Component Value Date   TRIG 129.0 11/04/2011   TRIG 92.0 01/31/2009   Lab Results  Component Value Date   CHOLHDL 4 11/04/2011   CHOLHDL 5 01/31/2009   No results found for: LDLDIRECT       Studies: No results found.  ASSESSMENT AND PLAN:  1. Palpitations and shortness of breath: Will check a basic metabolic panel and magnesium level to look for hypokalemia and hypomagnesemia as potential etiologies for frequent PVCs. I will obtain an echocardiogram to evaluate cardiac structure and function. I will also start short-acting metoprolol 12.5 mg twice daily for symptom relief.  2. Sleep apnea: Obtain sleep study. Educated on increased risk of stroke, MI, and arrhythmias with untreated sleep apnea.  3. Exercise counseling provided. Encouraged to begin walking 3-4 times per week.  Dispo: f/u 2 months.   Signed: Prentice DockerSuresh Koneswaran, M.D., F.A.C.C.  03/22/2015, 8:43 AM

## 2015-03-27 ENCOUNTER — Observation Stay (HOSPITAL_COMMUNITY)
Admission: EM | Admit: 2015-03-27 | Discharge: 2015-03-28 | Disposition: A | Discharge status: Home / Self Care | Payer: BLUE CROSS/BLUE SHIELD | Attending: Internal Medicine | Admitting: Internal Medicine

## 2015-03-27 ENCOUNTER — Encounter (HOSPITAL_COMMUNITY): Payer: Self-pay | Admitting: Adult Health

## 2015-03-27 DIAGNOSIS — N920 Excessive and frequent menstruation with regular cycle: Secondary | ICD-10-CM | POA: Diagnosis not present

## 2015-03-27 DIAGNOSIS — R55 Syncope and collapse: Secondary | ICD-10-CM | POA: Diagnosis present

## 2015-03-27 DIAGNOSIS — D649 Anemia, unspecified: Secondary | ICD-10-CM | POA: Diagnosis present

## 2015-03-27 DIAGNOSIS — Z79899 Other long term (current) drug therapy: Secondary | ICD-10-CM | POA: Insufficient documentation

## 2015-03-27 DIAGNOSIS — G43909 Migraine, unspecified, not intractable, without status migrainosus: Secondary | ICD-10-CM | POA: Insufficient documentation

## 2015-03-27 DIAGNOSIS — Z880 Allergy status to penicillin: Secondary | ICD-10-CM | POA: Diagnosis not present

## 2015-03-27 DIAGNOSIS — J45909 Unspecified asthma, uncomplicated: Secondary | ICD-10-CM | POA: Insufficient documentation

## 2015-03-27 DIAGNOSIS — N390 Urinary tract infection, site not specified: Secondary | ICD-10-CM | POA: Insufficient documentation

## 2015-03-27 DIAGNOSIS — D500 Iron deficiency anemia secondary to blood loss (chronic): Secondary | ICD-10-CM | POA: Diagnosis not present

## 2015-03-27 DIAGNOSIS — F329 Major depressive disorder, single episode, unspecified: Secondary | ICD-10-CM | POA: Diagnosis not present

## 2015-03-27 DIAGNOSIS — E079 Disorder of thyroid, unspecified: Secondary | ICD-10-CM | POA: Insufficient documentation

## 2015-03-27 LAB — BASIC METABOLIC PANEL
Anion gap: 9 (ref 5–15)
BUN: 15 mg/dL (ref 6–20)
CHLORIDE: 104 mmol/L (ref 101–111)
CO2: 24 mmol/L (ref 22–32)
Calcium: 9.2 mg/dL (ref 8.9–10.3)
Creatinine, Ser: 0.81 mg/dL (ref 0.44–1.00)
GFR calc Af Amer: >60 mL/min (ref >60)
GFR calc non Af Amer: >60 mL/min (ref >60)
GLUCOSE: 108 mg/dL — AB (ref 65–99)
POTASSIUM: 4.1 mmol/L (ref 3.5–5.1)
Sodium: 137 mmol/L (ref 135–145)

## 2015-03-27 LAB — I-STAT TROPONIN, ED: Troponin i, poc: 0 ng/mL (ref 0.00–0.08)

## 2015-03-27 LAB — I-STAT BETA HCG BLOOD, ED (MC, WL, AP ONLY): I-stat hCG, quantitative: <5 m[IU]/mL (ref <5)

## 2015-03-27 LAB — CBC
HEMATOCRIT: 18.3 % — AB (ref 36.0–46.0)
Hemoglobin: 5.7 g/dL — CL (ref 12.0–15.0)
MCH: 25.3 pg — AB (ref 26.0–34.0)
MCHC: 31.1 g/dL (ref 30.0–36.0)
MCV: 81.3 fL (ref 78.0–100.0)
PLATELETS: 431 10*3/uL — AB (ref 150–400)
RBC: 2.25 MIL/uL — ABNORMAL LOW (ref 3.87–5.11)
RDW: 16.5 % — ABNORMAL HIGH (ref 11.5–15.5)
WBC: 13.7 10*3/uL — ABNORMAL HIGH (ref 4.0–10.5)

## 2015-03-27 LAB — ABO/RH: ABO/RH(D): O NEG

## 2015-03-27 LAB — PREPARE RBC (CROSSMATCH)

## 2015-03-27 MED ORDER — LEVOTHYROXINE SODIUM 200 MCG PO TABS
200.0000 ug | ORAL_TABLET | Freq: Every day | ORAL | Status: DC
  Administered 2015-03-28: 200 ug via ORAL
  Filled 2015-03-27: qty 1

## 2015-03-27 MED ORDER — METOPROLOL TARTRATE 12.5 MG HALF TABLET
12.5000 mg | ORAL_TABLET | Freq: Two times a day (BID) | ORAL | Status: DC
  Filled 2015-03-27: qty 1

## 2015-03-27 NOTE — ED Notes (Signed)
Dr. Effie Shywentz notified of critical Hgb

## 2015-03-27 NOTE — ED Notes (Addendum)
Family member of patient, had syncopal episode while sitting in chair, became pale, diaphoretic and lost consciousness, laid pt on stretcher and color returned, initial rate HR 110, unable to palpate radial pulse at time, denies pain, CBG 96, HR 75, BP 130/70 lying flat. She is alert and responding normally at this time.  She reports that she felt like arms and legs were going numb and everything was just leaving her at the moment it happened.

## 2015-03-27 NOTE — ED Provider Notes (Signed)
CSN: [id number]     Arrival date & time 03/27/15  2118 History   First MD Initiated Contact with Patient 03/27/15 2233     Chief Complaint  Patient presents with  . Loss of Consciousness     (Consider location/radiation/quality/duration/timing/severity/associated sxs/prior Treatment) HPI   Krystal Roberson is a 39 y.o. female who was in the emergency room with her husband, talking to the nurse, when she became pale, diaphoretic and lost consciousness. She was laid on a stretcher, and her color returned and she awoke. She has been having palpitations for several weeks. She has heavy menses, and is currently on her period. Periods are usually 4 days, but she changes pads frequently. She had some blood work done 4 days ago, but her CBC was checked at that time. She denies headache, back pain, fever, chills, cough, shortness of breath. There are no other known modifying factors.    Past Medical History  Diagnosis Date  . Depression   . Thyroid disease   . Migraine   . Allergy   . UTI (lower urinary tract infection)   . Asthma   . NWGNFAOZ(308.6)    Past Surgical History  Procedure Laterality Date  . Cesarean section     Family History  Problem Relation Age of Onset  . Arthritis Other   . Cancer Other     breast, 1st degree, less 50  . Diabetes Other     1st degree   . Hypertension Other   . Depression Other   . Stroke Other     1st degree, less 50  . Coronary artery disease Other    Social History  Substance Use Topics  . Smoking status: Never Smoker   . Smokeless tobacco: Never Used  . Alcohol Use: No   OB History    No data available     Review of Systems  All other systems reviewed and are negative.     Allergies  Penicillins  Home Medications   Prior to Admission medications   Medication Sig Start Date End Date Taking? Authorizing Provider  Aspirin-Acetaminophen-Caffeine (EXCEDRIN PO) Take 2 tablets by mouth every 6 (six) hours as needed (for  headache).   Yes Historical Provider, MD  ibuprofen (ADVIL,MOTRIN) 200 MG tablet Take 200 mg by mouth every 6 (six) hours as needed for moderate pain.    Yes Historical Provider, MD  levothyroxine (SYNTHROID) 200 MCG tablet Take 1 tablet (200 mcg total) by mouth daily before breakfast. 07/19/14  Yes Nelwyn Salisbury, MD  metoprolol tartrate (LOPRESSOR) 25 MG tablet Take 0.5 tablets (12.5 mg total) by mouth 2 (two) times daily. 03/22/15   Laqueta Linden, MD   BP 121/71 mmHg  Pulse 75  Temp(Src) 98 F (36.7 C) (Oral)  Resp 13  SpO2 100%  LMP 03/23/2015 Physical Exam  Constitutional: She is oriented to person, place, and time. She appears well-developed and well-nourished.  HENT:  Head: Normocephalic and atraumatic.  Right Ear: External ear normal.  Left Ear: External ear normal.  Eyes: Conjunctivae and EOM are normal. Pupils are equal, round, and reactive to light.  Neck: Normal range of motion and phonation normal. Neck supple.  Cardiovascular: Normal rate, regular rhythm and normal heart sounds.   Pulmonary/Chest: Effort normal and breath sounds normal. She exhibits no bony tenderness.  Abdominal: Soft. There is no tenderness.  Musculoskeletal: Normal range of motion.  Neurological: She is alert and oriented to person, place, and time. No cranial nerve deficit or sensory  deficit. She exhibits normal muscle tone. Coordination normal.  Skin: Skin is warm, dry and intact.  Psychiatric: She has a normal mood and affect. Her behavior is normal. Judgment and thought content normal.  Nursing note and vitals reviewed.   ED Course  Procedures (including critical care time)  Medications - No data to display  Patient Vitals for the past 24 hrs:  BP Temp Temp src Pulse Resp SpO2  03/27/15 2145 121/71 mmHg - - 75 13 100 %  03/27/15 2122 130/70 mmHg 98 F (36.7 C) Oral 81 18 98 %    10:46 PM Reevaluation with update and discussion. After initial assessment and treatment, an updated  evaluation reveals findings discussed with patient and husband, all questions answered. Krystal Roberson     Labs Review Labs Reviewed  BASIC METABOLIC PANEL - Abnormal; Notable for the following:    Glucose, Bld 108 (*)    All other components within normal limits  CBC - Abnormal; Notable for the following:    WBC 13.7 (*)    RBC 2.25 (*)    Hemoglobin 5.7 (*)    HCT 18.3 (*)    MCH 25.3 (*)    RDW 16.5 (*)    Platelets 431 (*)    All other components within normal limits  URINALYSIS, ROUTINE W REFLEX MICROSCOPIC (NOT [facility])  CBG MONITORING, ED  I-STAT BETA HCG BLOOD, ED ([facility], [facility], [facility] ONLY)  I-STAT TROPOININ, ED  TYPE AND SCREEN  PREPARE RBC (CROSSMATCH)    Imaging Review No results found. I have personally reviewed and evaluated these images and lab results as part of my medical decision-making.   EKG Interpretation None        Date: 03/27/15- MUSE hyperlink is inactive  Rate: 81  Rhythm: normal sinus rhythm  QRS Axis: normal  PR and QT Intervals: normal  ST/T Wave abnormalities: normal  PR and QRS Conduction Disutrbances:none  Narrative Interpretation:   Old EKG Reviewed: none available    MDM   Final diagnoses:  Iron deficiency anemia due to chronic blood loss  Menorrhagia with regular cycle    Symptomatic anemia, with syncope. Her anemia is likely source of her recent palpitations. She will require admission for transfusion, monitoring and possible GYN consultation.  Nursing Notes Reviewed/ Care Coordinated Applicable Imaging Reviewed Interpretation of Laboratory Data incorporated into ED treatment  Plan: Admit    Mancel BaleElliott Ellaree Gear, MD 03/27/15 2348

## 2015-03-27 NOTE — H&P (Signed)
[facility] History and Physical  AHMIA COLFORD ZOX:[id number] DOB: [birth year] DOA: 03/27/2015  Referring physician: EDP PCP: Nelwyn Salisbury, MD   Chief Complaint: Syncope   HPI: Krystal Roberson is a 39 y.o. female who presents to the ED after a syncopal episode.  Work up in the ED is significant for HGB of 5.7.  Patient has been having menorrhagia for some time now, and notes several month history of palpitations, fatigue, generalized weakness, SOB, chest pain (looks like since at least July based on PCP visits in care everywhere).  Review of Systems: Negative for stigmata of GIB, Systems reviewed.  As above, otherwise negative  Past Medical History  Diagnosis Date  . Depression   . Thyroid disease   . Migraine   . Allergy   . UTI (lower urinary tract infection)   . Asthma   . WJXBJYNW(295.6)    Past Surgical History  Procedure Laterality Date  . Cesarean section     Social History:  reports that she has never smoked. She has never used smokeless tobacco. She reports that she does not drink alcohol or use illicit drugs.  Allergies  Allergen Reactions  . Penicillins Hives    Family History  Problem Relation Age of Onset  . Arthritis Other   . Cancer Other     breast, 1st degree, less 50  . Diabetes Other     1st degree   . Hypertension Other   . Depression Other   . Stroke Other     1st degree, less 50  . Coronary artery disease Other      Prior to Admission medications   Medication Sig Start Date End Date Taking? Authorizing Provider  Aspirin-Acetaminophen-Caffeine (EXCEDRIN PO) Take 2 tablets by mouth every 6 (six) hours as needed (for headache).   Yes Historical Provider, MD  ibuprofen (ADVIL,MOTRIN) 200 MG tablet Take 200 mg by mouth every 6 (six) hours as needed for moderate pain.    Yes Historical Provider, MD  levothyroxine (SYNTHROID) 200 MCG tablet Take 1 tablet (200 mcg total) by mouth daily before breakfast. 07/19/14  Yes Nelwyn Salisbury, MD   metoprolol tartrate (LOPRESSOR) 25 MG tablet Take 0.5 tablets (12.5 mg total) by mouth 2 (two) times daily. 03/22/15   Laqueta Linden, MD   Physical Exam: Filed Vitals:   03/27/15 2145  BP: 121/71  Pulse: 75  Temp:   Resp: 13    BP 121/71 mmHg  Pulse 75  Temp(Src) 98 F (36.7 C) (Oral)  Resp 13  SpO2 100%  LMP 03/23/2015  General Appearance:    Alert, oriented, no distress, appears stated age  Head:    Normocephalic, atraumatic  Eyes:    PERRL, EOMI, sclera non-icteric        Nose:   Nares without drainage or epistaxis. Mucosa, turbinates normal  Throat:   Moist mucous membranes. Oropharynx without erythema or exudate.  Neck:   Supple. No carotid bruits.  No thyromegaly.  No lymphadenopathy.   Back:     No CVA tenderness, no spinal tenderness  Lungs:     Clear to auscultation bilaterally, without wheezes, rhonchi or rales  Chest wall:    No tenderness to palpitation  Heart:    Regular rate and rhythm without murmurs, gallops, rubs  Abdomen:     Soft, non-tender, nondistended, normal bowel sounds, no organomegaly  Genitalia:    deferred  Rectal:    deferred  Extremities:   No clubbing, cyanosis or edema.  Pulses:   2+ and symmetric all extremities  Skin:   Skin color, texture, turgor normal, no rashes or lesions  Lymph nodes:   Cervical, supraclavicular, and axillary nodes normal  Neurologic:   CNII-XII intact. Normal strength, sensation and reflexes      throughout    Labs on Admission:  Basic Metabolic Panel:  Recent Labs Lab 03/27/15 2142  NA 137  K 4.1  CL 104  CO2 24  GLUCOSE 108*  BUN 15  CREATININE 0.81  CALCIUM 9.2   Liver Function Tests: No results for input(s): AST, ALT, ALKPHOS, BILITOT, PROT, ALBUMIN in the last 168 hours. No results for input(s): LIPASE, AMYLASE in the last 168 hours. No results for input(s): AMMONIA in the last 168 hours. CBC:  Recent Labs Lab 03/27/15 2142  WBC 13.7*  HGB 5.7*  HCT 18.3*  MCV 81.3  PLT 431*    Cardiac Enzymes: No results for input(s): CKTOTAL, CKMB, CKMBINDEX, TROPONINI in the last 168 hours.  BNP (last 3 results) No results for input(s): PROBNP in the last 8760 hours. CBG: No results for input(s): GLUCAP in the last 168 hours.  Radiological Exams on Admission: No results found.  EKG: Independently reviewed.  Assessment/Plan Principal Problem:   Symptomatic anemia Active Problems:   Menorrhagia   1. Symptomatic anemia -  1. HGB of 5.7 2. Transfusing 2 units PRBC 3. Recheck in AM 2. Menorrhagia -  1. Likely will need to be put on progestin containing OCP 2. Consider GYN consult or curbside in AM.    Code Status: Full Code  Family Communication: Husband at bedside Disposition Plan: Admit to obs   Time spent: 70 min  Quamaine Webb M. [facility] Pager [phone]  If 7AM-7PM, please contact the day team taking care of the patient [email or web address] Password [email or web address] 03/27/2015, 11:29 PM

## 2015-03-28 ENCOUNTER — Encounter: Payer: Self-pay | Admitting: *Deleted

## 2015-03-28 DIAGNOSIS — D649 Anemia, unspecified: Secondary | ICD-10-CM | POA: Diagnosis not present

## 2015-03-28 DIAGNOSIS — N920 Excessive and frequent menstruation with regular cycle: Secondary | ICD-10-CM | POA: Diagnosis not present

## 2015-03-28 LAB — CBC
HEMATOCRIT: 31.5 % — AB (ref 36.0–46.0)
HEMATOCRIT: 36.5 % (ref 36.0–46.0)
HEMOGLOBIN: 9.8 g/dL — AB (ref 12.0–15.0)
Hemoglobin: 11.6 g/dL — ABNORMAL LOW (ref 12.0–15.0)
MCH: 25.3 pg — ABNORMAL LOW (ref 26.0–34.0)
MCH: 26 pg (ref 26.0–34.0)
MCHC: 31.1 g/dL (ref 30.0–36.0)
MCHC: 31.8 g/dL (ref 30.0–36.0)
MCV: 81.2 fL (ref 78.0–100.0)
MCV: 81.7 fL (ref 78.0–100.0)
Platelets: 294 10*3/uL (ref 150–400)
Platelets: 295 10*3/uL (ref 150–400)
RBC: 3.88 MIL/uL (ref 3.87–5.11)
RBC: 4.47 MIL/uL (ref 3.87–5.11)
RDW: 16.2 % — AB (ref 11.5–15.5)
RDW: 15.7 % — AB (ref 11.5–15.5)
WBC: 10.7 10*3/uL — ABNORMAL HIGH (ref 4.0–10.5)
WBC: 8.5 10*3/uL (ref 4.0–10.5)

## 2015-03-28 LAB — BASIC METABOLIC PANEL
ANION GAP: 11 (ref 5–15)
BUN: 17 mg/dL (ref 6–20)
CALCIUM: 9 mg/dL (ref 8.9–10.3)
CHLORIDE: 102 mmol/L (ref 101–111)
CO2: 23 mmol/L (ref 22–32)
CREATININE: 0.74 mg/dL (ref 0.44–1.00)
GFR calc Af Amer: >60 mL/min (ref >60)
GFR calc non Af Amer: >60 mL/min (ref >60)
GLUCOSE: 116 mg/dL — AB (ref 65–99)
Potassium: 4 mmol/L (ref 3.5–5.1)
Sodium: 136 mmol/L (ref 135–145)

## 2015-03-28 LAB — VITAMIN B12: Vitamin B-12: 366 pg/mL (ref 180–914)

## 2015-03-28 LAB — RETICULOCYTES
RBC.: 2.27 MIL/uL — ABNORMAL LOW (ref 3.87–5.11)
Retic Count, Absolute: 47.7 10*3/uL (ref 19.0–186.0)
Retic Ct Pct: 2.1 % (ref 0.4–3.1)

## 2015-03-28 LAB — GLUCOSE, CAPILLARY: Glucose-Capillary: 96 mg/dL (ref 65–99)

## 2015-03-28 LAB — IRON AND TIBC
IRON: 27 ug/dL — AB (ref 28–170)
Saturation Ratios: 6 % — ABNORMAL LOW (ref 10.4–31.8)
TIBC: 461 ug/dL — AB (ref 250–450)
UIBC: 434 ug/dL

## 2015-03-28 LAB — FOLATE: Folate: 20 ng/mL (ref >5.9)

## 2015-03-28 LAB — FERRITIN: FERRITIN: 5 ng/mL — AB (ref 11–307)

## 2015-03-28 MED ORDER — ACETAMINOPHEN 500 MG PO TABS: 500.0000 mg | ORAL_TABLET | Freq: Four times a day (QID) | ORAL | Status: DC | PRN

## 2015-03-28 MED ORDER — FERROUS SULFATE 325 (65 FE) MG PO TABS: 325.0000 mg | ORAL_TABLET | Freq: Three times a day (TID) | ORAL | Status: DC

## 2015-03-28 MED ORDER — DOCUSATE SODIUM 100 MG PO CAPS: 100.0000 mg | ORAL_CAPSULE | Freq: Two times a day (BID) | ORAL | Status: DC | PRN

## 2015-03-28 MED ORDER — FERROUS SULFATE 325 (65 FE) MG PO TABS
325.0000 mg | ORAL_TABLET | Freq: Three times a day (TID) | ORAL | Status: DC
  Administered 2015-03-28: 325 mg via ORAL
  Filled 2015-03-28: qty 1

## 2015-03-28 MED ORDER — ACETAMINOPHEN 500 MG PO TABS
1000.0000 mg | ORAL_TABLET | Freq: Four times a day (QID) | ORAL | Status: DC | PRN
  Administered 2015-03-28: 1000 mg via ORAL
  Filled 2015-03-28: qty 2

## 2015-03-28 NOTE — Progress Notes (Signed)
Patient Discharge: Disposition: patient discharged home with husband. Education: Reviewed medications, prescriptions, follow-up appointments, and discharge instructions, understood and acknowledged. IV: Discontinued before discharge. Telemetry: Discontinued before discharge, CCMD notified. Transportation: Patient transported in w/c out of the unit accompanied by the staff and husband. Belongings: patient took all her belongings with her.

## 2015-03-28 NOTE — Discharge Summary (Signed)
Physician Discharge Summary  Krystal Roberson WUJ:[id number] DOB: 02/11/1976 DOA: 03/27/2015  PCP: Nelwyn SalisburyFRY,STEPHEN A, MD  Admit date: 03/27/2015 Discharge date: 03/28/2015  Time spent: 35 minutes  Recommendations for Outpatient Follow-up:  Needs referral to GYN.  Needs CBC to follow hb  Discharge Diagnoses:    Symptomatic anemia   Menorrhagia   Hypothyroidism.  Discharge Condition: Stable.   Diet recommendation: Regular Diet.   Filed Weights   03/28/15 0004  Weight: 121.836 kg (268 lb 9.6 oz)    History of present illness:  Krystal Motendrea M Briseno is a 39 y.o. female who presents to the ED after a syncopal episode. Work up in the ED is significant for HGB of 5.7. Patient has been having menorrhagia for some time now, and notes several month history of palpitations, fatigue, generalized weakness, SOB, chest pain (looks like since at least July based on PCP visits in care everywhere).  Hospital Course:  1-Symptomatic anemia:  Secondary to menorrhagia.  Hb on admission at 5.7---9.8---increased to 11.6 after 2 units of PRBC.  She is feeling better, no more dizziness or lightheaded.  She will be discharge on ferrous sulfate.   2-Syncope; in setting of anemia. EKG sinus. Resolved.   3-Menorrhagia; patient will follow up with primary GYN. She relates her menstrual period has stop.   4-Palpitation: She was started recently on metoprolol by her cardiologist. She has not taken this medication. Her palpitation might be from anemia. Will hold metoprolol. She will follow with her cardiologist   Procedures:  none  Consultations:  none  Discharge Exam: Filed Vitals:   03/28/15 0855  BP: 127/73  Pulse: 91  Temp: 98.3 F (36.8 C)  Resp: 18    General: Alert in no Acute distress Cardiovascular: S 1, S 2 RRR Respiratory: CTA  Discharge Instructions   Discharge Instructions    Diet - low sodium heart healthy    Complete by:  As directed      Increase activity slowly     Complete by:  As directed           Current Discharge Medication List    START taking these medications   Details  acetaminophen (TYLENOL) 500 MG tablet Take 1 tablet (500 mg total) by mouth every 6 (six) hours as needed for moderate pain. Qty: 30 tablet, Refills: 0    docusate sodium (COLACE) 100 MG capsule Take 1 capsule (100 mg total) by mouth 2 (two) times daily as needed for mild constipation. Qty: 10 capsule, Refills: 0    ferrous sulfate 325 (65 FE) MG tablet Take 1 tablet (325 mg total) by mouth 3 (three) times daily with meals. Qty: 90 tablet, Refills: 3      CONTINUE these medications which have NOT CHANGED   Details  levothyroxine (SYNTHROID) 200 MCG tablet Take 1 tablet (200 mcg total) by mouth daily before breakfast. Qty: 90 tablet, Refills: 3      STOP taking these medications     Aspirin-Acetaminophen-Caffeine (EXCEDRIN PO)      ibuprofen (ADVIL,MOTRIN) 200 MG tablet      metoprolol tartrate (LOPRESSOR) 25 MG tablet        Allergies  Allergen Reactions  . Penicillins Hives   Follow-up Information    Follow up with FRY,STEPHEN A, MD In 1 week.   Specialty:  Family Medicine   Contact information:   [address] [place] [address] [phone]        The results of significant diagnostics from this hospitalization (  including imaging, microbiology, ancillary and laboratory) are listed below for reference.    Significant Diagnostic Studies: No results found.  Microbiology: No results found for this or any previous visit (from the past 240 hour(s)).   Labs: Basic Metabolic Panel:  Recent Labs Lab 03/27/15 2142 03/28/15 0141  NA 137 136  K 4.1 4.0  CL 104 102  CO2 24 23  GLUCOSE 108* 116*  BUN 15 17  CREATININE 0.81 0.74  CALCIUM 9.2 9.0   Liver Function Tests: No results for input(s): AST, ALT, ALKPHOS, BILITOT, PROT, ALBUMIN in the last 168 hours. No results for input(s): LIPASE, AMYLASE in the last 168 hours. No  results for input(s): AMMONIA in the last 168 hours. CBC:  Recent Labs Lab 03/27/15 2142 03/28/15 0141 03/28/15 1113  WBC 13.7* 10.7* 8.5  HGB 5.7* 9.8* 11.6*  HCT 18.3* 31.5* 36.5  MCV 81.3 81.2 81.7  PLT 431* 294 295   Cardiac Enzymes: No results for input(s): CKTOTAL, CKMB, CKMBINDEX, TROPONINI in the last 168 hours. BNP: BNP (last 3 results) No results for input(s): BNP in the last 8760 hours.  ProBNP (last 3 results) No results for input(s): PROBNP in the last 8760 hours.  CBG:  Recent Labs Lab 03/27/15 2117  GLUCAP 96       Signed:  Ananth Fiallos A  [facility] 03/28/2015, 2:29 PM

## 2015-03-28 NOTE — Progress Notes (Signed)
Admission note:  Arrival Method: Pt arrived on stretcher from ED Mental Orientation: Alert and oriented x 4 Telemetry: Telemetry box 6e04 applied. CCMD notified Assessment: Completed, see flowsheets Skin: Dry and intact. No open areas noted IV: Right and left AC IV's. Sites clean, dry and intact.  Pain: Pt states no pain at this time.  Tubes: N/A Safety Measures: Bed in lowest position, non-slip socks placed, call light within reach Fall Prevention Safety Plan: Reviewed with patient, patient is a low fall risk Admission Screening: In progress, see flowsheets 6700 Orientation: Patient has been oriented to the unit, staff and to the room.  Patient lying in bed with no needs stated at this time. Orders have been reviewed and implemented. Call light within reach, will continue to monitor.  Feliciana RossettiLaura Mckayla Mulcahey, RN, BSN

## 2015-03-29 LAB — TYPE AND SCREEN
ABO/RH(D): O NEG
Antibody Screen: NEGATIVE
UNIT DIVISION: 0
Unit division: 0

## 2015-04-05 ENCOUNTER — Telehealth: Payer: Self-pay | Admitting: *Deleted

## 2015-04-05 ENCOUNTER — Ambulatory Visit (INDEPENDENT_AMBULATORY_CARE_PROVIDER_SITE_OTHER): Payer: BLUE CROSS/BLUE SHIELD

## 2015-04-05 ENCOUNTER — Other Ambulatory Visit: Payer: Self-pay

## 2015-04-05 DIAGNOSIS — R000 Tachycardia, unspecified: Secondary | ICD-10-CM

## 2015-04-05 DIAGNOSIS — R002 Palpitations: Secondary | ICD-10-CM | POA: Diagnosis not present

## 2015-04-05 NOTE — Telephone Encounter (Signed)
-----   Message from Laqueta LindenSuresh A Koneswaran, MD sent at 04/05/2015  3:24 PM EST ----- Normal pumping function.

## 2015-04-05 NOTE — Telephone Encounter (Signed)
Notes Recorded by Lesle ChrisAngela G Jaydee Ingman, LPN on 86/[id number]/846911/23/2016 at 3:42 PM Patient notified. Copy to pmd. Follow up already scheduled for 05/29/15 - [place].

## 2015-04-08 ENCOUNTER — Emergency Department (HOSPITAL_COMMUNITY): Payer: BLUE CROSS/BLUE SHIELD

## 2015-04-08 ENCOUNTER — Emergency Department (HOSPITAL_COMMUNITY)
Admission: EM | Admit: 2015-04-08 | Discharge: 2015-04-08 | Disposition: A | Discharge status: Home / Self Care | Payer: BLUE CROSS/BLUE SHIELD | Attending: Emergency Medicine | Admitting: Emergency Medicine

## 2015-04-08 ENCOUNTER — Encounter (HOSPITAL_COMMUNITY): Payer: Self-pay | Admitting: *Deleted

## 2015-04-08 DIAGNOSIS — J45909 Unspecified asthma, uncomplicated: Secondary | ICD-10-CM | POA: Diagnosis not present

## 2015-04-08 DIAGNOSIS — R002 Palpitations: Secondary | ICD-10-CM | POA: Diagnosis not present

## 2015-04-08 DIAGNOSIS — R0789 Other chest pain: Secondary | ICD-10-CM

## 2015-04-08 DIAGNOSIS — Z8659 Personal history of other mental and behavioral disorders: Secondary | ICD-10-CM | POA: Diagnosis not present

## 2015-04-08 DIAGNOSIS — R531 Weakness: Secondary | ICD-10-CM | POA: Diagnosis not present

## 2015-04-08 DIAGNOSIS — R5383 Other fatigue: Secondary | ICD-10-CM | POA: Diagnosis not present

## 2015-04-08 DIAGNOSIS — E079 Disorder of thyroid, unspecified: Secondary | ICD-10-CM | POA: Diagnosis not present

## 2015-04-08 DIAGNOSIS — R079 Chest pain, unspecified: Secondary | ICD-10-CM | POA: Diagnosis not present

## 2015-04-08 DIAGNOSIS — Z3202 Encounter for pregnancy test, result negative: Secondary | ICD-10-CM | POA: Insufficient documentation

## 2015-04-08 DIAGNOSIS — Z8679 Personal history of other diseases of the circulatory system: Secondary | ICD-10-CM | POA: Insufficient documentation

## 2015-04-08 DIAGNOSIS — D649 Anemia, unspecified: Secondary | ICD-10-CM | POA: Insufficient documentation

## 2015-04-08 DIAGNOSIS — Z87440 Personal history of urinary (tract) infections: Secondary | ICD-10-CM | POA: Diagnosis not present

## 2015-04-08 DIAGNOSIS — Z79899 Other long term (current) drug therapy: Secondary | ICD-10-CM | POA: Diagnosis not present

## 2015-04-08 DIAGNOSIS — Z880 Allergy status to penicillin: Secondary | ICD-10-CM | POA: Diagnosis not present

## 2015-04-08 LAB — BASIC METABOLIC PANEL
Anion gap: 6 (ref 5–15)
BUN: 13 mg/dL (ref 6–20)
CALCIUM: 9.1 mg/dL (ref 8.9–10.3)
CO2: 26 mmol/L (ref 22–32)
CREATININE: 0.64 mg/dL (ref 0.44–1.00)
Chloride: 104 mmol/L (ref 101–111)
GFR calc non Af Amer: >60 mL/min (ref >60)
GLUCOSE: 104 mg/dL — AB (ref 65–99)
Potassium: 4.2 mmol/L (ref 3.5–5.1)
Sodium: 136 mmol/L (ref 135–145)

## 2015-04-08 LAB — CBC
HCT: 37.6 % (ref 36.0–46.0)
Hemoglobin: 11.8 g/dL — ABNORMAL LOW (ref 12.0–15.0)
MCH: 26.2 pg (ref 26.0–34.0)
MCHC: 31.4 g/dL (ref 30.0–36.0)
MCV: 83.4 fL (ref 78.0–100.0)
PLATELETS: 229 10*3/uL (ref 150–400)
RBC: 4.51 MIL/uL (ref 3.87–5.11)
RDW: 16.4 % — AB (ref 11.5–15.5)
WBC: 8.1 10*3/uL (ref 4.0–10.5)

## 2015-04-08 LAB — URINALYSIS, ROUTINE W REFLEX MICROSCOPIC
BILIRUBIN URINE: NEGATIVE
Glucose, UA: NEGATIVE mg/dL
HGB URINE DIPSTICK: NEGATIVE
Ketones, ur: NEGATIVE mg/dL
Leukocytes, UA: NEGATIVE
Nitrite: NEGATIVE
PROTEIN: NEGATIVE mg/dL
Specific Gravity, Urine: 1.01 (ref 1.005–1.030)
pH: 6.5 (ref 5.0–8.0)

## 2015-04-08 LAB — I-STAT TROPONIN, ED: Troponin i, poc: 0 ng/mL (ref 0.00–0.08)

## 2015-04-08 LAB — POC URINE PREG, ED: Preg Test, Ur: NEGATIVE

## 2015-04-08 LAB — HEPATIC FUNCTION PANEL
ALK PHOS: 105 U/L (ref 38–126)
ALT: 25 U/L (ref 14–54)
AST: 21 U/L (ref 15–41)
Albumin: 3.7 g/dL (ref 3.5–5.0)
BILIRUBIN TOTAL: 0.5 mg/dL (ref 0.3–1.2)
Total Protein: 7 g/dL (ref 6.5–8.1)

## 2015-04-08 LAB — D-DIMER, QUANTITATIVE (NOT AT ARMC): D DIMER QUANT: 0.67 ug{FEU}/mL — AB (ref 0.00–0.50)

## 2015-04-08 LAB — CBG MONITORING, ED: GLUCOSE-CAPILLARY: 100 mg/dL — AB (ref 65–99)

## 2015-04-08 MED ORDER — IOHEXOL 350 MG/ML SOLN
100.0000 mL | Freq: Once | INTRAVENOUS | Status: AC | PRN
  Administered 2015-04-08: 100 mL via INTRAVENOUS

## 2015-04-08 MED ORDER — SODIUM CHLORIDE 0.9 % IV BOLUS (SEPSIS)
1000.0000 mL | Freq: Once | INTRAVENOUS | Status: AC
  Administered 2015-04-08: 1000 mL via INTRAVENOUS

## 2015-04-08 NOTE — ED Provider Notes (Signed)
CSN: [id number]     Arrival date & time 04/08/15  4098 History   First MD Initiated Contact with Patient 04/08/15 [phone]     Chief Complaint  Patient presents with  . Palpitations  . Fatigue     (Consider location/radiation/quality/duration/timing/severity/associated sxs/prior Treatment) HPI  39 year old female presents with recurrent weakness and fatigue that started 2 days ago. Feels similar but not as bad as when she was diagnosed with anemia with a hemoglobin of 5.2. At that time she passed out, has not this time. She received 2 units of blood in her hemoglobin was up to 11 or discharge. Patient has been on iron since. Her stools been darker but not black. No blood in her stool or current menstrual bleeding at this time. She has noticed chest tightness over the last 2 days and since last night has had some right side pain under her ribs that worsens with inspiration. Some recurrent palpitations. Has noticed tingling/paresthesisas in her bilateral arms that is similar to when she was her 1.5 weeks ago.  Past Medical History  Diagnosis Date  . Depression   . Thyroid disease   . Migraine   . Allergy   . UTI (lower urinary tract infection)   . Asthma   . YNWGNFAO(130.8)    Past Surgical History  Procedure Laterality Date  . Cesarean section     Family History  Problem Relation Age of Onset  . Arthritis Other   . Cancer Other     breast, 1st degree, less 50  . Diabetes Other     1st degree   . Hypertension Other   . Depression Other   . Stroke Other     1st degree, less 50  . Coronary artery disease Other    Social History  Substance Use Topics  . Smoking status: Never Smoker   . Smokeless tobacco: Never Used  . Alcohol Use: No   OB History    No data available     Review of Systems  Constitutional: Positive for fatigue. Negative for fever.  Cardiovascular: Positive for chest pain. Negative for leg swelling.  Gastrointestinal: Negative for abdominal pain and blood  in stool.  Genitourinary: Negative for dysuria and vaginal bleeding.  Neurological: Positive for weakness.  All other systems reviewed and are negative.     Allergies  Penicillins  Home Medications   Prior to Admission medications   Medication Sig Start Date End Date Taking? Authorizing Provider  acetaminophen (TYLENOL) 500 MG tablet Take 1 tablet (500 mg total) by mouth every 6 (six) hours as needed for moderate pain. 03/28/15   Belkys A Regalado, MD  docusate sodium (COLACE) 100 MG capsule Take 1 capsule (100 mg total) by mouth 2 (two) times daily as needed for mild constipation. 03/28/15   Belkys A Regalado, MD  ferrous sulfate 325 (65 FE) MG tablet Take 1 tablet (325 mg total) by mouth 3 (three) times daily with meals. 03/28/15   Belkys A Regalado, MD  levothyroxine (SYNTHROID) 200 MCG tablet Take 1 tablet (200 mcg total) by mouth daily before breakfast. 07/19/14   Nelwyn Salisbury, MD   BP 139/83 mmHg  Pulse 86  Temp(Src) 98 F (36.7 C) (Oral)  Resp 11  SpO2 98%  LMP 03/23/2015 Physical Exam  Constitutional: She is oriented to person, place, and time. She appears well-developed and well-nourished.  HENT:  Head: Normocephalic and atraumatic.  Right Ear: External ear normal.  Left Ear: External ear normal.  Nose: Nose normal.  Eyes: Right eye exhibits no discharge. Left eye exhibits no discharge.  Cardiovascular: Normal rate, regular rhythm and normal heart sounds.   Pulmonary/Chest: Effort normal and breath sounds normal. She exhibits tenderness (over right mid-axillary lower chest over ribs).  Abdominal: Soft. She exhibits no distension. There is no tenderness.  Neurological: She is alert and oriented to person, place, and time.  Skin: Skin is warm and dry.  Nursing note and vitals reviewed.   ED Course  Procedures (including critical care time) Labs Review Labs Reviewed  BASIC METABOLIC PANEL - Abnormal; Notable for the following:    Glucose, Bld 104 (*)    All other  components within normal limits  CBC - Abnormal; Notable for the following:    Hemoglobin 11.8 (*)    RDW 16.4 (*)    All other components within normal limits  D-DIMER, QUANTITATIVE (NOT AT [facility]) - Abnormal; Notable for the following:    D-Dimer, Quant 0.67 (*)    All other components within normal limits  HEPATIC FUNCTION PANEL - Abnormal; Notable for the following:    Bilirubin, Direct <0.1 (*)    All other components within normal limits  CBG MONITORING, ED - Abnormal; Notable for the following:    Glucose-Capillary 100 (*)    All other components within normal limits  URINALYSIS, ROUTINE W REFLEX MICROSCOPIC (NOT AT [facility]  Rosezena Sensor, ED  POC URINE PREG, ED    Imaging Review Dg Chest 2 View  04/08/2015  CLINICAL DATA:  Weakness with chest tightness since yesterday. Nausea and paresthesia is within arms and legs. Similar symptoms 2 weeks ago during blood transfusion. EXAM: CHEST  2 VIEW COMPARISON:  07/02/2003 FINDINGS: The heart size and mediastinal contours are within normal limits. Both lungs are clear. The visualized skeletal structures are unremarkable. IMPRESSION: No active cardiopulmonary disease. Electronically Signed   By: Elberta Fortis M.D.   On: 04/08/2015 07:47   Ct Angio Chest Pe W/cm &/or Wo Cm  04/08/2015  CLINICAL DATA:  Right lateral rib pain and chest heaviness since last night. EXAM: CT ANGIOGRAPHY CHEST WITH CONTRAST TECHNIQUE: Multidetector CT imaging of the chest was performed using the standard protocol during bolus administration of intravenous contrast. Multiplanar CT image reconstructions and MIPs were obtained to evaluate the vascular anatomy. CONTRAST:  OMNIPAQUE IOHEXOL 350 MG/ML SOLN COMPARISON:  Chest x-ray today FINDINGS: Lungs are adequately inflated without consolidation or effusion. Airways are within normal. Minimal cardiomegaly. Mild pectus excavatum deformity. No evidence of pulmonary embolism. No mediastinal, hilar or axillary  adenopathy. Remaining mediastinal structures are within normal. Images through the upper abdomen demonstrate a small lipoma over the dome of the liver. Remainder the exam is within normal. Review of the MIP images confirms the above findings. IMPRESSION: No acute cardiopulmonary disease. No evidence of pulmonary embolism. Mild cardiomegaly.  Mild pectus excavatum deformity. Electronically Signed   By: Elberta Fortis M.D.   On: 04/08/2015 09:09   I have personally reviewed and evaluated these images and lab results as part of my medical decision-making.   EKG Interpretation   Date/Time:  Saturday April 08 2015 06:39:12 EST Ventricular Rate:  87 PR Interval:  146 QRS Duration: 90 QT Interval:  373 QTC Calculation: 449 R Axis:   54 Text Interpretation:  Normal sinus rhythm Normal ECG no significant change  since Mar 27 2015 Confirmed by Criss Alvine  MD, Jaiona Simien [id number] on 04/08/2015  6:57:26 AM      MDM   Final diagnoses:  Other fatigue  Chest tightness    It is unclear why the patient is having fatigue nonspecific paresthesias. Her hemoglobin is stable at 11.8. EKG is unremarkable. Her chest pain seems quite atypical. Benign ECG and negative troponin. Workup for pulmonary embolism shows no PE. Given no obvious findings for why she is fatigued and having chest tightness, I feel she is stable for discharge to follow-up closely with her PCP. Low suspicion for ACS. Discussed strict return precautions.    Pricilla LovelessScott Mykenzi Vanzile, MD 04/08/15 [phone]

## 2015-04-08 NOTE — ED Notes (Signed)
Patient transported to X-ray 

## 2015-04-08 NOTE — ED Notes (Signed)
Pt reports palpitations, chest pains and generalized fatigue x2 days - pt admits that her symptoms feel similar to when she previously passed out x2 weeks ago when her hgb was 5.2. Pt A&Ox4 and in no acute distress at present.

## 2015-04-11 ENCOUNTER — Ambulatory Visit: Payer: BLUE CROSS/BLUE SHIELD | Admitting: Family Medicine

## 2015-04-13 ENCOUNTER — Encounter: Payer: Self-pay | Admitting: Family Medicine

## 2015-04-13 ENCOUNTER — Ambulatory Visit (INDEPENDENT_AMBULATORY_CARE_PROVIDER_SITE_OTHER): Payer: BLUE CROSS/BLUE SHIELD | Admitting: Family Medicine

## 2015-04-13 VITALS — BP 120/86 | Temp 98.5°F | Ht 66.0 in | Wt 270.0 lb

## 2015-04-13 DIAGNOSIS — R1011 Right upper quadrant pain: Secondary | ICD-10-CM | POA: Diagnosis not present

## 2015-04-13 DIAGNOSIS — D649 Anemia, unspecified: Secondary | ICD-10-CM | POA: Diagnosis not present

## 2015-04-13 NOTE — Progress Notes (Signed)
Pre visit review using our clinic review tool, if applicable. No additional management support is needed unless otherwise documented below in the visit note. 

## 2015-04-13 NOTE — Progress Notes (Signed)
   Subjective:    Patient ID: Krystal Roberson, female    DOB: 12-14-1975, 39 y.o.   MRN: [id number]  HPI Here to follow up a hospital stay from 03-27-15 to 03-28-15 and then an ER visit on 04-08-15. The first ER visit was for generalized weakness, palpitations, and SOB. She was found to have a Hgb of 5.7 and she was transfused 2 PRBC units. Her Hgb came up to 11.6 and it has remained stable since then. At her second ER visit this was 11.8. It was presumed that her anemia was from menorrhagia, since she has had heavy menses for years. She has not seen Dr. Maxie BetterSheronette Cousins, her GYN, for many years. However she went to the ER the second visit with very similar symptoms to the ones that she had the first time, including tingling in the arms and hands, SOB, and some RUQ abdominal pain. Her workup and labs were normal this time, and they even ruled out a PE by getting a chest CT angiogram. No explanation for her symptoms was found and she was sent home. Since then she has felt fairly well, but she continues to have mild intermittent RUQ pain. Her BMs are normal, and they have been black in color for a few weeks (but this is not surprising since she is taking iron pills now). No heartburn or indigestion. She had a cholecystectomy one year ago.    Review of Systems  Constitutional: Positive for fatigue.  HENT: Negative.   Eyes: Negative.   Respiratory: Negative for cough, shortness of breath and wheezing.   Cardiovascular: Negative.   Gastrointestinal: Positive for nausea and abdominal pain. Negative for vomiting, diarrhea, constipation, blood in stool, abdominal distention, anal bleeding and rectal pain.  Endocrine: Negative.   Neurological: Negative.   Psychiatric/Behavioral: Negative.        Objective:   Physical Exam  Constitutional: She is oriented to person, place, and time. She appears well-developed and well-nourished. No distress.  Neck: No thyromegaly present.  Cardiovascular: Normal  rate, regular rhythm, normal heart sounds and intact distal pulses.   Pulmonary/Chest: Effort normal and breath sounds normal.  Abdominal: Soft. Bowel sounds are normal. She exhibits no distension and no mass. There is no rebound and no guarding.  Mildly tender in the RUQ  Lymphadenopathy:    She has no cervical adenopathy.  Neurological: She is alert and oriented to person, place, and time.  Psychiatric: She has a normal mood and affect. Her behavior is normal. Thought content normal.          Assessment & Plan:  She definitely has menorrhagia, and I encouraged her to see her GYN, Dr. Cherly Hensenousins, as soon as possible. However with her RUQ pain we need to make sure she is not having GI blood losses, from a duodenal ulcer for example. Advised her to start taking Nexium OTC daily. Set up an abdominal US, and refer to GI for possible endoscopies.

## 2015-04-14 ENCOUNTER — Encounter: Payer: Self-pay | Admitting: Gastroenterology

## 2015-04-20 ENCOUNTER — Other Ambulatory Visit: Payer: BLUE CROSS/BLUE SHIELD

## 2015-04-28 ENCOUNTER — Other Ambulatory Visit: Payer: BLUE CROSS/BLUE SHIELD

## 2015-05-09 ENCOUNTER — Ambulatory Visit
Admission: RE | Admit: 2015-05-09 | Discharge: 2015-05-09 | Disposition: A | Discharge status: Home / Self Care | Payer: BLUE CROSS/BLUE SHIELD | Source: Ambulatory Visit | Attending: Family Medicine | Admitting: Family Medicine

## 2015-05-09 DIAGNOSIS — R1011 Right upper quadrant pain: Secondary | ICD-10-CM

## 2015-05-14 HISTORY — PX: ENDOMETRIAL ABLATION W/ NOVASURE: SUR434

## 2015-05-18 ENCOUNTER — Other Ambulatory Visit: Payer: Self-pay | Admitting: Obstetrics and Gynecology

## 2015-05-25 ENCOUNTER — Other Ambulatory Visit: Payer: Self-pay | Admitting: Obstetrics and Gynecology

## 2015-05-25 DIAGNOSIS — N6310 Unspecified lump in the right breast, unspecified quadrant: Secondary | ICD-10-CM

## 2015-05-25 DIAGNOSIS — R928 Other abnormal and inconclusive findings on diagnostic imaging of breast: Secondary | ICD-10-CM

## 2015-05-29 ENCOUNTER — Ambulatory Visit: Payer: BLUE CROSS/BLUE SHIELD | Admitting: Cardiovascular Disease

## 2015-05-31 ENCOUNTER — Ambulatory Visit
Admission: RE | Admit: 2015-05-31 | Discharge: 2015-05-31 | Disposition: A | Discharge status: Home / Self Care | Payer: BLUE CROSS/BLUE SHIELD | Source: Ambulatory Visit | Attending: Obstetrics and Gynecology | Admitting: Obstetrics and Gynecology

## 2015-05-31 DIAGNOSIS — N6310 Unspecified lump in the right breast, unspecified quadrant: Secondary | ICD-10-CM

## 2015-05-31 DIAGNOSIS — R928 Other abnormal and inconclusive findings on diagnostic imaging of breast: Secondary | ICD-10-CM

## 2015-06-14 ENCOUNTER — Ambulatory Visit: Payer: BLUE CROSS/BLUE SHIELD | Admitting: Gastroenterology

## 2015-08-08 ENCOUNTER — Other Ambulatory Visit: Payer: Self-pay | Admitting: Family Medicine

## 2015-08-09 ENCOUNTER — Encounter: Payer: Self-pay | Admitting: Family Medicine

## 2015-08-09 ENCOUNTER — Ambulatory Visit (INDEPENDENT_AMBULATORY_CARE_PROVIDER_SITE_OTHER): Payer: BLUE CROSS/BLUE SHIELD | Admitting: Family Medicine

## 2015-08-09 VITALS — BP 120/80 | HR 97 | Temp 98.7°F | Ht 66.0 in | Wt 275.0 lb

## 2015-08-09 DIAGNOSIS — E038 Other specified hypothyroidism: Secondary | ICD-10-CM

## 2015-08-09 LAB — T4, FREE: Free T4: 0.89 ng/dL (ref 0.60–1.60)

## 2015-08-09 LAB — T3, FREE: T3, Free: 3.2 pg/mL (ref 2.3–4.2)

## 2015-08-09 LAB — TSH: TSH: 1.76 u[IU]/mL (ref 0.35–4.50)

## 2015-08-09 MED ORDER — LEVOTHYROXINE SODIUM 200 MCG PO TABS: 200.0000 ug | ORAL_TABLET | Freq: Every day | ORAL | Status: DC

## 2015-08-09 NOTE — Progress Notes (Signed)
Pre visit review using our clinic review tool, if applicable. No additional management support is needed unless otherwise documented below in the visit note. 

## 2015-08-11 ENCOUNTER — Encounter: Payer: Self-pay | Admitting: Family Medicine

## 2015-08-11 NOTE — Progress Notes (Signed)
   Subjective:    Patient ID: Krystal Roberson, female    DOB: [birth year], 40 y.o.   MRN: [id number]  HPI Here to follow up on hypothyroidism. She feels well in general, her weight has been stable, although she would like to lose some weight.    Review of Systems  Constitutional: Negative.   Respiratory: Negative.   Cardiovascular: Negative.   Endocrine: Negative.   Neurological: Negative.        Objective:   Physical Exam  Constitutional: She is oriented to person, place, and time. She appears well-developed and well-nourished.  Neck: No thyromegaly present.  Cardiovascular: Normal rate, regular rhythm, normal heart sounds and intact distal pulses.   Pulmonary/Chest: Effort normal and breath sounds normal.  Lymphadenopathy:    She has no cervical adenopathy.  Neurological: She is alert and oriented to person, place, and time.          Assessment & Plan:  Hypothyroidism. Check labs including a thyroid panel.  Nelwyn SalisburyFRY,STEPHEN A, MD

## 2015-09-25 ENCOUNTER — Telehealth: Payer: Self-pay | Admitting: Family Medicine

## 2015-09-25 DIAGNOSIS — E039 Hypothyroidism, unspecified: Secondary | ICD-10-CM

## 2015-09-25 NOTE — Telephone Encounter (Signed)
Referral was done  

## 2015-09-25 NOTE — Telephone Encounter (Signed)
Pt call to request a referral to a Endocrinology  Krystal BellingSean Ellison

## 2015-09-25 NOTE — Telephone Encounter (Signed)
I spoke with pt  

## 2015-10-19 ENCOUNTER — Ambulatory Visit (INDEPENDENT_AMBULATORY_CARE_PROVIDER_SITE_OTHER): Payer: BLUE CROSS/BLUE SHIELD | Admitting: Endocrinology

## 2015-10-19 ENCOUNTER — Encounter: Payer: Self-pay | Admitting: Endocrinology

## 2015-10-19 VITALS — BP 132/84 | HR 103 | Ht 66.0 in | Wt 281.0 lb

## 2015-10-19 DIAGNOSIS — R635 Abnormal weight gain: Secondary | ICD-10-CM

## 2015-10-19 DIAGNOSIS — R002 Palpitations: Secondary | ICD-10-CM | POA: Diagnosis not present

## 2015-10-19 MED ORDER — LEVOTHYROXINE SODIUM 175 MCG PO TABS: 175.0000 ug | ORAL_TABLET | Freq: Every day | ORAL | Status: DC

## 2015-10-19 MED ORDER — DEXAMETHASONE 1 MG PO TABS: ORAL_TABLET | ORAL | Status: DC

## 2015-10-19 NOTE — Patient Instructions (Addendum)
Let's check a 24-HR urine for "adrenaline."   Please reduce the levothyroxine pill.  i have sent a prescription to your pharmacy. In 1 month, you should do a "dexamethasone suppression test."  for this, you would take dexamethasone 1 mg at 10 pm (i have sent a prescription to your pharmacy), then come in for a "cortisol" blood test the next morning before 9 am.  you do not need to be fasting for this test.  We'll recheck the thyroid at the same time.  Please consider having weight loss surgery.  It is good for your health.  Here is some information about it.  If you decide to consider further, please call the phone number in the papers, and register for a free informational meeting I would be happy to see you back here as needed.

## 2015-10-19 NOTE — Progress Notes (Signed)
Subjective:    Patient ID: SHAKINA CHOY, female    DOB: 02-24-1976, 40 y.o.   MRN: [id number]  HPI Pt reports hypothyroidism was dx'ed in 1997  she has been on prescribed thyroid hormone therapy since then.  she has never taken kelp or any other type of non-prescribed thyroid product.  she has never had thyroid imaging.  She is not considering another pregnancy (she has had uterine ablation).  she has never had thyroid surgery, or XRT to the neck.  He has never been on amiodarone or lithium.  She has slight palpitations in the chest, and assoc weight gain.  She says the palpitations cause her to skip the synthroid approx 5 times per month.   Past Medical History  Diagnosis Date  . Depression   . Thyroid disease   . Migraine   . Allergy   . UTI (lower urinary tract infection)   . Asthma   . WUJWJXBJ(478.2)     Past Surgical History  Procedure Laterality Date  . Cesarean section    . Cholecystectomy  03-2014    at [facility]   . Endometrial ablation w/ novasure  2017    per Dr. Cherly Hensen     Social History   Social History  . Marital Status: Married    Spouse Name: N/A  . Number of Children: N/A  . Years of Education: N/A   Occupational History  . Not on file.   Social History Main Topics  . Smoking status: Never Smoker   . Smokeless tobacco: Never Used  . Alcohol Use: No  . Drug Use: No  . Sexual Activity: Not on file   Other Topics Concern  . Not on file   Social History Narrative    Current Outpatient Prescriptions on File Prior to Visit  Medication Sig Dispense Refill  . acetaminophen (TYLENOL) 500 MG tablet Take 1 tablet (500 mg total) by mouth every 6 (six) hours as needed for moderate pain. 30 tablet 0  . cholecalciferol (VITAMIN D) 1000 units tablet Take 2,000 Units by mouth daily.     Marland Kitchen docusate sodium (COLACE) 100 MG capsule Take 1 capsule (100 mg total) by mouth 2 (two) times daily as needed for mild constipation. 10 capsule 0  . ferrous  sulfate 325 (65 FE) MG tablet Take 1 tablet (325 mg total) by mouth 3 (three) times daily with meals. (Patient taking differently: Take 325 mg by mouth daily with breakfast. ) 90 tablet 3   No current facility-administered medications on file prior to visit.      Family History  Problem Relation Age of Onset  . Arthritis Other   . Cancer Other     breast, 1st degree, less 50  . Diabetes Other     1st degree   . Hypertension Other   . Depression Other   . Stroke Other     1st degree, less 50  . Coronary artery disease Other     BP 132/84 mmHg  Pulse 103  Ht  (1.676 m)  Wt 281 lb (127.461 kg)  BMI 45.38 kg/m2  SpO2 94%  Review of Systems denies sob, constipation, numbness, blurry vision, cold intolerance, and rhinorrhea.  She has leg cramps, tremor, hair loss, depression, easy bruising, and fatigue. She had a syncopal episode in 2016--cause was found to be severe anemia.      Objective:   Physical Exam VS: see vs page GEN: no distress.  Morbid obesity.  HEAD: head:  no deformity eyes: no periorbital swelling, no proptosis external nose and ears are normal mouth: no lesion seen NECK: supple, thyroid is not enlarged CHEST WALL: no deformity LUNGS: clear to auscultation.   CV: reg rate and rhythm, no murmur.   ABD: abdomen is soft, nontender.  no hepatosplenomegaly.  not distended.  no hernia.  No striae.  MUSCULOSKELETAL: muscle bulk and strength are grossly normal.  no obvious joint swelling.  gait is normal and steady EXTEMITIES: no deformity.  no ulcer on the feet.  feet are of normal color and temp.  Trace bilat leg edema.  PULSES: dorsalis pedis intact bilat.  no carotid bruit NEURO:  cn 2-12 grossly intact.   readily moves all 4's.  sensation is intact to touch on the feet SKIN:  Normal texture and temperature.  No rash or suspicious lesion is visible.   NODES:  None palpable at the neck PSYCH: alert, well-oriented.  Does not appear anxious nor  depressed.   Lab Results  Component Value Date   TSH 1.76 08/09/2015   Chest CT (2016) no mention is made of the thyroid.   I have reviewed outside records, and summarized: Pt was noted to be stable on synthroid, but suffered from weight gain.       Assessment & Plan:  Chronic primary hypothyroidism, new to me.  Noncompliance with synthroid: if she takes qd as rx'ed, she will need a reduced dosage.   Weight gain: not thyroid-related.  Palpitations, uncertain etiology.   Patient is advised the following: Patient Instructions  Let's check a 24-HR urine for "adrenaline."   Please reduce the levothyroxine pill.  i have sent a prescription to your pharmacy. In 1 month, you should do a "dexamethasone suppression test."  for this, you would take dexamethasone 1 mg at 10 pm (i have sent a prescription to your pharmacy), then come in for a "cortisol" blood test the next morning before 9 am.  you do not need to be fasting for this test.  We'll recheck the thyroid at the same time.  Please consider having weight loss surgery.  It is good for your health.  Here is some information about it.  If you decide to consider further, please call the phone number in the papers, and register for a free informational meeting I would be happy to see you back here as needed.  Romero BellingELLISON, Javonne Louissaint, MD

## 2016-02-07 ENCOUNTER — Ambulatory Visit (INDEPENDENT_AMBULATORY_CARE_PROVIDER_SITE_OTHER): Payer: BLUE CROSS/BLUE SHIELD | Admitting: Family Medicine

## 2016-02-07 ENCOUNTER — Encounter: Payer: Self-pay | Admitting: Family Medicine

## 2016-02-07 VITALS — BP 134/70 | Temp 98.3°F | Ht 66.0 in | Wt 279.0 lb

## 2016-02-07 DIAGNOSIS — E039 Hypothyroidism, unspecified: Secondary | ICD-10-CM | POA: Diagnosis not present

## 2016-02-07 DIAGNOSIS — R21 Rash and other nonspecific skin eruption: Secondary | ICD-10-CM | POA: Diagnosis not present

## 2016-02-07 DIAGNOSIS — R635 Abnormal weight gain: Secondary | ICD-10-CM | POA: Diagnosis not present

## 2016-02-07 LAB — CBC WITH DIFFERENTIAL/PLATELET
BASOS PCT: 0.4 % (ref 0.0–3.0)
Basophils Absolute: 0 10*3/uL (ref 0.0–0.1)
EOS ABS: 0.3 10*3/uL (ref 0.0–0.7)
EOS PCT: 3 % (ref 0.0–5.0)
HCT: 37.9 % (ref 36.0–46.0)
HEMOGLOBIN: 12.9 g/dL (ref 12.0–15.0)
Lymphocytes Relative: 17.1 % (ref 12.0–46.0)
Lymphs Abs: 1.5 10*3/uL (ref 0.7–4.0)
MCHC: 33.9 g/dL (ref 30.0–36.0)
MCV: 87.5 fl (ref 78.0–100.0)
MONO ABS: 0.4 10*3/uL (ref 0.1–1.0)
Monocytes Relative: 4.1 % (ref 3.0–12.0)
NEUTROS ABS: 6.6 10*3/uL (ref 1.4–7.7)
Neutrophils Relative %: 75.4 % (ref 43.0–77.0)
PLATELETS: 286 10*3/uL (ref 150.0–400.0)
RBC: 4.33 Mil/uL (ref 3.87–5.11)
RDW: 14.5 % (ref 11.5–15.5)
WBC: 8.7 10*3/uL (ref 4.0–10.5)

## 2016-02-07 LAB — BASIC METABOLIC PANEL
BUN: 13 mg/dL (ref 6–23)
CALCIUM: 8.8 mg/dL (ref 8.4–10.5)
CO2: 27 mEq/L (ref 19–32)
Chloride: 103 mEq/L (ref 96–112)
Creatinine, Ser: 0.69 mg/dL (ref 0.40–1.20)
GFR: 99.27 mL/min (ref >60.00)
GLUCOSE: 108 mg/dL — AB (ref 70–99)
POTASSIUM: 3.9 meq/L (ref 3.5–5.1)
SODIUM: 139 meq/L (ref 135–145)

## 2016-02-07 LAB — HEPATIC FUNCTION PANEL
ALBUMIN: 3.8 g/dL (ref 3.5–5.2)
ALT: 22 U/L (ref 0–35)
AST: 17 U/L (ref 0–37)
Alkaline Phosphatase: 108 U/L (ref 39–117)
BILIRUBIN TOTAL: 0.3 mg/dL (ref 0.2–1.2)
Bilirubin, Direct: 0.1 mg/dL (ref 0.0–0.3)
Total Protein: 7.1 g/dL (ref 6.0–8.3)

## 2016-02-07 LAB — SEDIMENTATION RATE: SED RATE: 37 mm/h — AB (ref 0–20)

## 2016-02-07 LAB — C-REACTIVE PROTEIN: CRP: 0.9 mg/dL (ref 0.5–20.0)

## 2016-02-07 NOTE — Progress Notes (Signed)
   Subjective:    Patient ID: Krystal Roberson, female    DOB: 1976-02-26, 40 y.o.   MRN: [id number]  HPI Here to discuss a 4 week history of intermittent swelling and redness on the face. This is not painful but it feels warm. No fever or SOB. No other rashes on the body. No joint swelling or pain. She had seen Dr. Loanne Drilling earlier this year for hypothyroidism and weight gain. He had planned to draw a furr thyroid panel, a dexamethasone suppression test, and a 24 hr urine for metanephrines and catecholamines. However [name] had a high deductible on her insurance so she did not pursue these tests. Now she is able to do these.    Review of Systems  Constitutional: Positive for unexpected weight change. Negative for activity change, appetite change and fever.  Respiratory: Negative.   Cardiovascular: Negative.   Gastrointestinal: Negative.   Genitourinary: Negative.   Skin: Positive for rash.  Neurological: Negative.        Objective:   Physical Exam  Constitutional: She is oriented to person, place, and time. She appears well-developed and well-nourished. No distress.  Neck: No thyromegaly present.  Cardiovascular: Normal rate, regular rhythm, normal heart sounds and intact distal pulses.   Pulmonary/Chest: Effort normal and breath sounds normal. No respiratory distress. She has no wheezes.  Musculoskeletal: She exhibits no edema.  Lymphadenopathy:    She has no cervical adenopathy.  Neurological: She is alert and oriented to person, place, and time.  Skin:  Her face has mild erythema and swelling across the forehead and over both cheeks. She shows me photos on her cell phone of her face over the past week, and these show more pronounced erythema on the face          Assessment & Plan:  Malar rash which suggests possible lupus. We will check labs today including ESR, CRP, ANA, and ds DNA antibodies. I also asked her to contact Dr. Loanne Drilling to pursue the lab tests he had ordered.    Laurey Morale, MD

## 2016-02-07 NOTE — Progress Notes (Signed)
Pre visit review using our clinic review tool, if applicable. No additional management support is needed unless otherwise documented below in the visit note. 

## 2016-02-08 LAB — ANA: Anti Nuclear Antibody(ANA): NEGATIVE

## 2016-02-08 LAB — ANTI-DNA ANTIBODY, DOUBLE-STRANDED: ds DNA Ab: 2 IU/mL

## 2016-03-15 ENCOUNTER — Encounter: Payer: Self-pay | Admitting: Family Medicine

## 2016-03-19 ENCOUNTER — Encounter: Payer: Self-pay | Admitting: Family Medicine

## 2016-03-19 ENCOUNTER — Ambulatory Visit (INDEPENDENT_AMBULATORY_CARE_PROVIDER_SITE_OTHER): Payer: BLUE CROSS/BLUE SHIELD | Admitting: Family Medicine

## 2016-03-19 VITALS — BP 134/80 | Temp 98.6°F | Ht 66.0 in | Wt 282.0 lb

## 2016-03-19 DIAGNOSIS — R29810 Facial weakness: Secondary | ICD-10-CM | POA: Diagnosis not present

## 2016-03-19 NOTE — Telephone Encounter (Signed)
These are totally different symptoms so I need her to come in to be re-examined and we can decide the next step. Have her make an OV ASAP

## 2016-03-19 NOTE — Progress Notes (Signed)
   Subjective:    Patient ID: Krystal Roberson, female    DOB: July 22, 1975, 40 y.o.   MRN: [id number]  HPI Here to discuss intermittent symptoms she has had for 6 weeks including drooping of the right side of her face, numbness of the right side of her face, and blurry vision in the right eye. No loss of visual fields or double vision. No headache or slurred speech.    Review of Systems  HENT: Negative.   Respiratory: Negative.   Cardiovascular: Negative.   Neurological: Positive for weakness and numbness. Negative for dizziness, tremors, seizures, syncope, facial asymmetry, speech difficulty, light-headedness and headaches.       Objective:   Physical Exam  Constitutional: She is oriented to person, place, and time. She appears well-developed and well-nourished.  Eyes: Conjunctivae and EOM are normal. Pupils are equal, round, and reactive to light.  Cardiovascular: Normal rate, regular rhythm, normal heart sounds and intact distal pulses.   Pulmonary/Chest: Effort normal and breath sounds normal.  Neurological: She is alert and oriented to person, place, and time. She has normal reflexes. No cranial nerve deficit. She exhibits normal muscle tone. Coordination normal.          Assessment & Plan:  Intermittent symptoms of the right face with a normal exam today. We will set up an MRI scan soon and refer to Neurology to evaluate. Nelwyn SalisburyFRY,STEPHEN A, MD

## 2016-03-19 NOTE — Progress Notes (Signed)
Pre visit review using our clinic review tool, if applicable. No additional management support is needed unless otherwise documented below in the visit note. 

## 2016-03-19 NOTE — Telephone Encounter (Signed)
Pt scheduled  

## 2016-03-19 NOTE — Telephone Encounter (Signed)
I spoke with pt and she would like to schedule office visit. Can you call and get patient scheduled, can work in anytime?

## 2016-03-29 ENCOUNTER — Telehealth: Payer: Self-pay | Admitting: Family Medicine

## 2016-03-29 MED ORDER — DIAZEPAM 5 MG PO TABS: ORAL_TABLET | ORAL | 0 refills | Status: DC

## 2016-03-29 NOTE — Telephone Encounter (Signed)
Pt would like to have something for her nerves pt is going to have a MRI done and would like to have something to keep her calm for the MRI.   Pharm:  [facility]  [place]

## 2016-03-29 NOTE — Telephone Encounter (Signed)
I called in script to [facility] and spoke with pt. 

## 2016-03-29 NOTE — Telephone Encounter (Signed)
Call in Valium 5 mg to take 30 minutes prior to a procedure, #10 with no rf

## 2016-04-01 ENCOUNTER — Ambulatory Visit
Admission: RE | Admit: 2016-04-01 | Discharge: 2016-04-01 | Disposition: A | Discharge status: Home / Self Care | Payer: BLUE CROSS/BLUE SHIELD | Source: Ambulatory Visit | Attending: Family Medicine | Admitting: Family Medicine

## 2016-04-01 DIAGNOSIS — R29810 Facial weakness: Secondary | ICD-10-CM

## 2016-04-01 MED ORDER — GADOBENATE DIMEGLUMINE 529 MG/ML IV SOLN
20.0000 mL | Freq: Once | INTRAVENOUS | Status: AC | PRN
Start: 2016-04-01 — End: 2016-04-01
  Administered 2016-04-01: 20 mL via INTRAVENOUS

## 2016-04-03 ENCOUNTER — Telehealth: Payer: Self-pay | Admitting: Family Medicine

## 2016-04-03 NOTE — Telephone Encounter (Signed)
Can you take a look at the results?

## 2016-04-03 NOTE — Telephone Encounter (Signed)
Pt would like to have her results from the MRI that she had done on 11/20

## 2016-04-03 NOTE — Telephone Encounter (Signed)
See my Result Note about a possible referral to Neurology

## 2016-04-03 NOTE — Telephone Encounter (Signed)
Inform patient the mri of head is normal  Very reassuring .  FU with Dr Clent RidgesFry PCP for further  Advice.

## 2016-04-03 NOTE — Telephone Encounter (Signed)
I spoke with pt and she has a upcoming appointment with neurology in January 2018.

## 2016-04-03 NOTE — Telephone Encounter (Signed)
I spoke with pt and went over results. She would like to know what other recommendations you have?

## 2016-04-09 NOTE — Telephone Encounter (Signed)
noted 

## 2016-05-29 ENCOUNTER — Ambulatory Visit: Payer: BLUE CROSS/BLUE SHIELD | Admitting: Neurology

## 2016-06-11 ENCOUNTER — Ambulatory Visit (INDEPENDENT_AMBULATORY_CARE_PROVIDER_SITE_OTHER): Payer: BLUE CROSS/BLUE SHIELD | Admitting: Family Medicine

## 2016-06-11 ENCOUNTER — Encounter: Payer: Self-pay | Admitting: Family Medicine

## 2016-06-11 VITALS — BP 134/99 | HR 93 | Temp 98.7°F | Ht 66.0 in | Wt 287.0 lb

## 2016-06-11 DIAGNOSIS — E039 Hypothyroidism, unspecified: Secondary | ICD-10-CM | POA: Diagnosis not present

## 2016-06-11 DIAGNOSIS — M940 Chondrocostal junction syndrome [Tietze]: Secondary | ICD-10-CM

## 2016-06-11 DIAGNOSIS — B349 Viral infection, unspecified: Secondary | ICD-10-CM

## 2016-06-11 DIAGNOSIS — R55 Syncope and collapse: Secondary | ICD-10-CM | POA: Diagnosis not present

## 2016-06-11 DIAGNOSIS — R030 Elevated blood-pressure reading, without diagnosis of hypertension: Secondary | ICD-10-CM | POA: Diagnosis not present

## 2016-06-11 LAB — T4, FREE: FREE T4: 0.56 ng/dL — AB (ref 0.60–1.60)

## 2016-06-11 LAB — T3, FREE: T3 FREE: 3.5 pg/mL (ref 2.3–4.2)

## 2016-06-11 LAB — TSH: TSH: 23.61 u[IU]/mL — ABNORMAL HIGH (ref 0.35–4.50)

## 2016-06-11 NOTE — Progress Notes (Signed)
   Subjective:    Patient ID: Krystal Roberson, female    DOB: 1975-07-28, 41 y.o.   MRN: [id number]  HPI Here to follow up an ER visit at a [facility] near [place] on 06-05-16 and then again at [facility] on 06-09-16. The first visit was for feeling lightheaded and almost passing out. No chest pain or SOB. Her exam and labs were all normal, and she was diagnosed with a viral illness and was given IV fluids. Then over the next few days she continued to feel weak and lightheaded and she developed sharp anterior chest pains. She denies any SOB, but she notes that taking a deep breath could be painful. Her BP jumped up to 204/116, so she went to [facility]. While there her labs remained normal, cardiac enzymes were normal, and a chest CT angiogram was normal. The only abnormality that appeared was an elevated TSH to 32. She was told to go home and rest. Today she feels better but she still as some chest pains.    Review of Systems  Constitutional: Positive for fatigue. Negative for fever.  Respiratory: Positive for cough and shortness of breath. Negative for wheezing.   Cardiovascular: Positive for chest pain. Negative for palpitations and leg swelling.  Gastrointestinal: Negative.   Neurological: Negative.        Objective:   Physical Exam  Constitutional: She appears well-developed and well-nourished. No distress.  Cardiovascular: Normal rate, regular rhythm, normal heart sounds and intact distal pulses.   Pulmonary/Chest: Effort normal and breath sounds normal. No respiratory distress. She has no wheezes. She has no rales.  Very tender along both sternocostal margins           Assessment & Plan:  She appears to have a costochondritis which is causing the chest pains. This was likely related to a viral illness she had for a few days over the past week. I think this will settle down and resolve over the next few days. We will not start her on BP meds because this seems to be a  temporary state. As for the elevated TSH, we will check a full thyroid panel today and address as needed.  Gershon CraneStephen Fry, MD

## 2016-06-11 NOTE — Progress Notes (Signed)
Pre visit review using our clinic review tool, if applicable. No additional management support is needed unless otherwise documented below in the visit note. 

## 2016-06-17 ENCOUNTER — Encounter: Payer: Self-pay | Admitting: Neurology

## 2016-06-17 ENCOUNTER — Ambulatory Visit (INDEPENDENT_AMBULATORY_CARE_PROVIDER_SITE_OTHER): Payer: BLUE CROSS/BLUE SHIELD | Admitting: Neurology

## 2016-06-17 VITALS — BP 136/72 | HR 98 | Ht 66.0 in | Wt 287.8 lb

## 2016-06-17 DIAGNOSIS — G43109 Migraine with aura, not intractable, without status migrainosus: Secondary | ICD-10-CM | POA: Diagnosis not present

## 2016-06-17 MED ORDER — TOPIRAMATE 25 MG PO TABS: 25.0000 mg | ORAL_TABLET | Freq: Two times a day (BID) | ORAL | 3 refills | Status: DC

## 2016-06-17 MED ORDER — LEVOTHYROXINE SODIUM 200 MCG PO TABS: 200.0000 ug | ORAL_TABLET | Freq: Every day | ORAL | 1 refills | Status: DC

## 2016-06-17 NOTE — Patient Instructions (Signed)
Migraine Recommendations: 1.  Start topiramate 25mg  twice daily.  Call in 4 weeks with update and we can adjust dose if needed.  Possible side effects include: impaired thinking, sedation, paresthesias (numbness and tingling) and weight loss.  It may cause dehydration and there is a small risk for kidney stones, so make sure to stay hydrated with water during the day.  There is also a very small risk for glaucoma, so if you notice any change in your vision while taking this medication, see an ophthalmologist.    2. Limit use of pain relievers to no more than 2 days out of the week.  These medications include acetaminophen, ibuprofen, Excedrin, triptans and narcotics.  This will help reduce risk of rebound headaches. 3.  Be aware of common food triggers such as processed sweets, processed foods with nitrites (such as deli meat, hot dogs, sausages), foods with MSG, alcohol (such as wine), chocolate, certain cheeses, certain fruits (dried fruits, some citrus fruit), vinegar, diet soda. 4.  Avoid caffeine 5.  Routine exercise 6.  Proper sleep hygiene 7.  Stay adequately hydrated with water 8.  Keep a headache diary. 9.  Maintain proper stress management. 10.  Do not skip meals. 11.  Consider supplements:  Magnesium citrate 400mg  to 600mg  daily, riboflavin 400mg , Coenzyme Q 10 100mg  three times daily 12.  Follow up in 3 months

## 2016-06-17 NOTE — Addendum Note (Signed)
Addended by: Aniceto BossNIMMONS, Hisham Provence A on: 06/17/2016 04:23 PM   Modules accepted: Orders

## 2016-06-17 NOTE — Progress Notes (Signed)
NEUROLOGY CONSULTATION NOTE  CONOR FILSAIME MRN: [id number] DOB: 02/02/1976  Referring provider: Dr. Clent Ridges Primary care provider: Dr. Clent Ridges  Reason for consult:  Facial weakness  HISTORY OF PRESENT ILLNESS: Krystal Roberson is a 41 year old right-handed female with thyroid disorder and morbid obesity who presents for right sided facial weakness.  History supplemented by PCP note.  In August 2017, she developed right facial weakness.  It lasted an hour and then resolved, however she continued to have mild residual weakness.  She would then experience recurrent flare ups of right facial weakness twice a week, lasting from an hour to all day.  There is associated right sided numbness and blurred vision in the right eye.  There is no associated change in hearing, tinnitus or unilateral weakness and numbness of the extremities.    She has remote history of headaches which returned about 1 to 2 years ago.  It is located on the top of her head, pounding, 4-8/10, lasting all day and occurring almost daily.  There is no preceding aura.  There is associated nausea, photophobia and phonophobia.  She treats it with Excedrin or Tylenol almost daily.  There is no particular trigger or relieving factor.  They do not specifically occur with episodes of facial weakness.  MRI of brain with and without contrast from 04/01/16 was personally reviewed and was unremarkable.  Antiinflammatory labs from 02/07/16, including ANA, CRP, and ds-DNA were negative.  Sed Rate was mildly elevated at 37 but no significant.  Stress/anxiety controlled.  PAST MEDICAL HISTORY: Past Medical History:  Diagnosis Date  . Allergy   . Asthma   . Depression   . Headache(784.0)   . Migraine   . Thyroid disease   . UTI (lower urinary tract infection)     PAST SURGICAL HISTORY: Past Surgical History:  Procedure Laterality Date  . CESAREAN SECTION    . CHOLECYSTECTOMY  03-2014   at [facility]   . ENDOMETRIAL ABLATION  W/ NOVASURE  2017   per Dr. Cherly Hensen     MEDICATIONS: Current Outpatient Prescriptions on File Prior to Visit  Medication Sig Dispense Refill  . acetaminophen (TYLENOL) 500 MG tablet Take 1 tablet (500 mg total) by mouth every 6 (six) hours as needed for moderate pain. 30 tablet 0  . cholecalciferol (VITAMIN D) 1000 units tablet Take 2,000 Units by mouth daily.     . ferrous sulfate 325 (65 FE) MG tablet Take 1 tablet (325 mg total) by mouth 3 (three) times daily with meals. (Patient taking differently: Take 325 mg by mouth daily with breakfast. ) 90 tablet 3  . levothyroxine (SYNTHROID, LEVOTHROID) 175 MCG tablet Take 1 tablet (175 mcg total) by mouth daily before breakfast. 30 tablet 11   No current facility-administered medications on file prior to visit.     ALLERGIES: PCN  FAMILY HISTORY: Family History  Problem Relation Age of Onset  . Arthritis Other   . Cancer Other     breast, 1st degree, less 50  . Diabetes Other     1st degree   . Hypertension Other   . Depression Other   . Stroke Other     1st degree, less 50  . Coronary artery disease Other     SOCIAL HISTORY: Social History   Social History  . Marital status: Married    Spouse name: N/A  . Number of children: N/A  . Years of education: N/A   Occupational History  . Not on file.  Social History Main Topics  . Smoking status: Never Smoker  . Smokeless tobacco: Never Used  . Alcohol use No  . Drug use: No  . Sexual activity: Not on file   Other Topics Concern  . Not on file   Social History Narrative  . No narrative on file    REVIEW OF SYSTEMS: Constitutional: No fevers, chills, or sweats, no generalized fatigue, change in appetite Eyes: No visual changes, double vision, eye pain Ear, nose and throat: No hearing loss, ear pain, nasal congestion, sore throat Cardiovascular: No chest pain, palpitations Respiratory:  No shortness of breath at rest or with exertion, wheezes GastrointestinaI: No  nausea, vomiting, diarrhea, abdominal pain, fecal incontinence Genitourinary:  No dysuria, urinary retention or frequency Musculoskeletal:  No neck pain, back pain Integumentary: No rash, pruritus, skin lesions Neurological: as above Psychiatric: No depression, insomnia, anxiety Endocrine: No palpitations, fatigue, diaphoresis, mood swings, change in appetite, change in weight, increased thirst Hematologic/Lymphatic:  No purpura, petechiae. Allergic/Immunologic: no itchy/runny eyes, nasal congestion, recent allergic reactions, rashes  PHYSICAL EXAM: Vitals:   06/17/16 1258  BP: 136/72  Pulse: 98   General: No acute distress.  Patient appears well-groomed.  Head:  Normocephalic/atraumatic Eyes:  fundi examined but not visualized Neck: supple, no paraspinal tenderness, full range of motion Back: No paraspinal tenderness Heart: regular rate and rhythm Lungs: Clear to auscultation bilaterally. Vascular: No carotid bruits. Neurological Exam: Mental status: alert and oriented to person, place, and time, recent and remote memory intact, fund of knowledge intact, attention and concentration intact, speech fluent and not dysarthric, language intact. Cranial nerves: CN I: not tested CN II: pupils equal, round and reactive to light, visual fields intact CN III, IV, VI:  full range of motion, no nystagmus, no ptosis CN V: facial sensation intact CN VII: upper and lower face symmetric CN VIII: hearing intact CN IX, X: gag intact, uvula midline CN XI: sternocleidomastoid and trapezius muscles intact CN XII: tongue midline Bulk & Tone: normal, no fasciculations. Motor:  5/5 throughout  Sensation: temperature and vibration sensation intact. Deep Tendon Reflexes:  2+ throughout, toes downgoing.  Finger to nose testing:  Without dysmetria.  Heel to shin:  Without dysmetria.  Gait:  Normal station and stride.  Able to turn and tandem walk. Romberg negative.  IMPRESSION: Recurrent transient  right facial weakness probable complicated migraine.  I do not suspect TIA, seizure or Bell's palsy.  PLAN: 1.  Will initiate topiramate 25mg  twice daily.  Side effects discussed.  Instructed not to get pregnant while taking it.  Contact us in 4 weeks with update and we can increase dose if needed. 2.  Limit use of pain relievers to no more than 2 days out of the week (includes Excedrin, Tylenol, ibuprofen) 3.  Be aware of common food triggers such as processed sweets, processed foods with nitrites (such as deli meat, hot dogs, sausages), foods with MSG, alcohol (such as wine), chocolate, certain cheeses, certain fruits (dried fruits, some citrus fruit), vinegar, diet soda. 4.  Avoid caffeine 5.  Routine exercise 6.  Proper sleep hygiene 7.  Stay adequately hydrated with water 8.  Keep a headache diary. 9.  Maintain proper stress management. 10.  Do not skip meals.  Diet and weight loss. 11.  Consider supplements:  Magnesium citrate 400mg  to 600mg  daily, riboflavin 400mg , Coenzyme Q 10 100mg  three times daily 12.  Follow up in 3 months  Thank you for allowing me to take part in the care of  this patient.  Shon MilletAdam Jaffe, DO  CC:  Gershon CraneStephen Fry, MD

## 2016-08-19 IMAGING — CT CT ANGIO CHEST
4 of 9 series · 19 of 46 positions shown · IV contrast (APPLIED)
Comparison: Chest x-ray today

CLINICAL DATA: Right lateral rib pain and chest heaviness since
last night.

EXAM:
CT ANGIOGRAPHY CHEST WITH CONTRAST
TECHNIQUE: Multidetector CT imaging of the chest was performed using the
standard protocol during bolus administration of intravenous
contrast. Multiplanar CT image reconstructions and MIPs were
obtained to evaluate the vascular anatomy.
CONTRAST:  100mL OMNIPAQUE IOHEXOL 350 MG/ML SOLN

[Series 4: pe 3.0 i30f 3 · axial · 0.89mm/px · z∈[+1280,+1436]mm · 4 of 88 slices shown]
[im 18/88  lung]
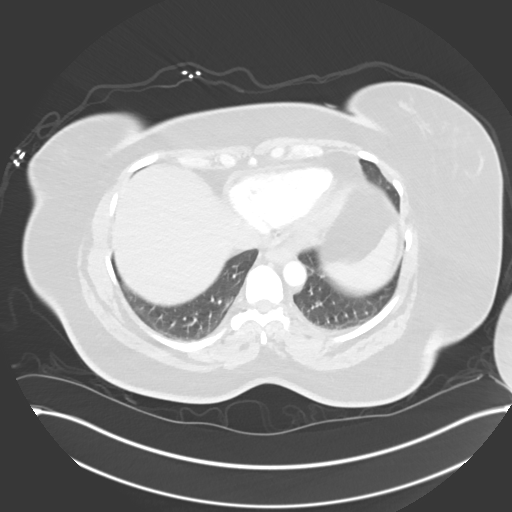
[im 35/88  soft-tissue]
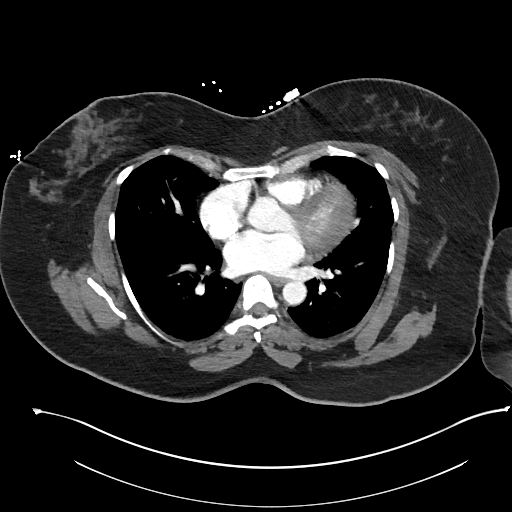
[im 53/88  lung]
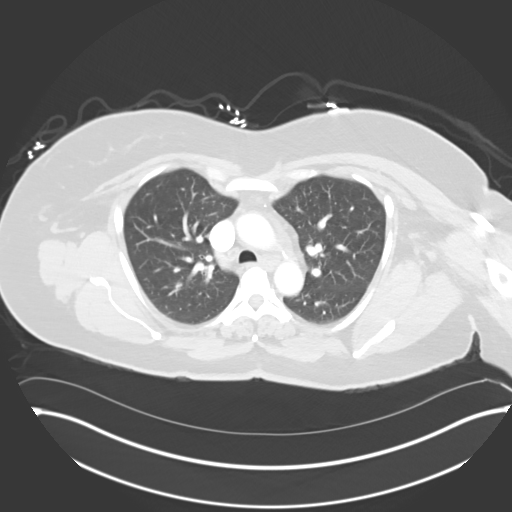
[im 70/88  soft-tissue]
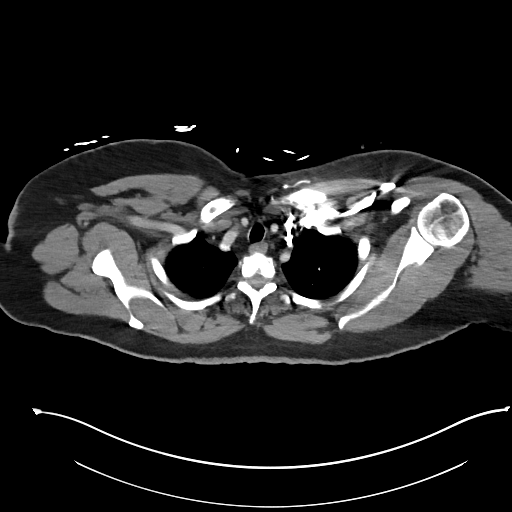

[Series 5: thins · axial · 0.89mm/px · z∈[+1259,+1471]mm · 8 of 259 slices shown]
[im 31/259  lung]
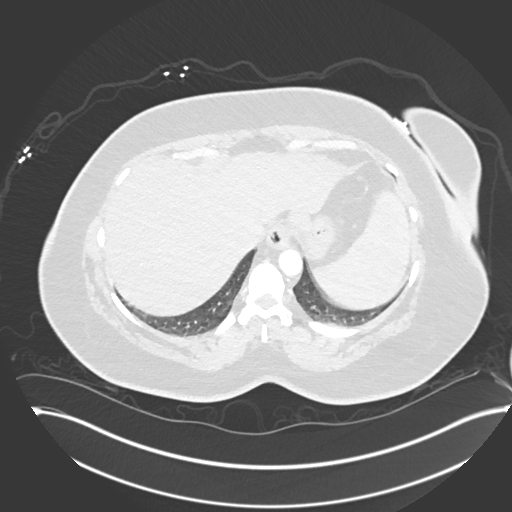
[im 61/259  lung]
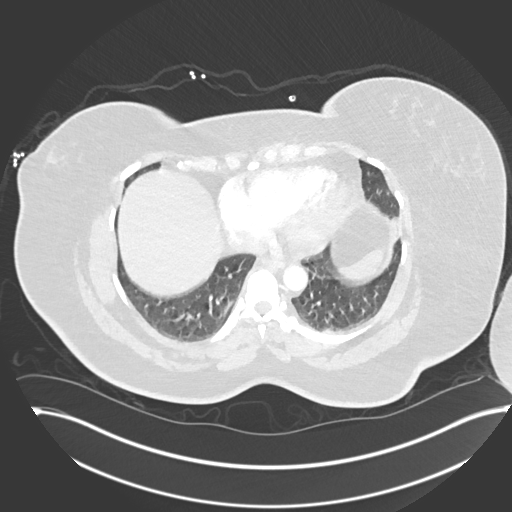
[im 92/259  lung]
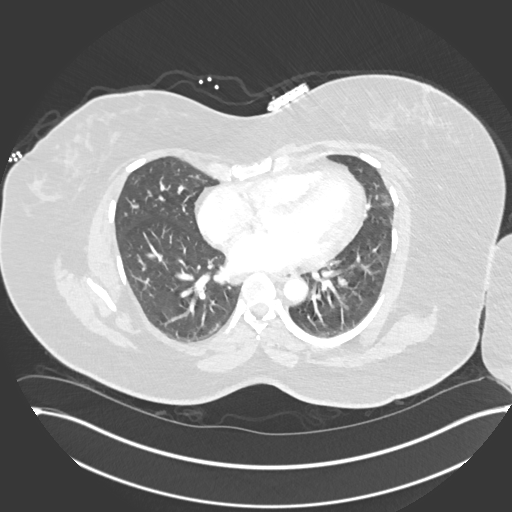
[im 122/259  lung]
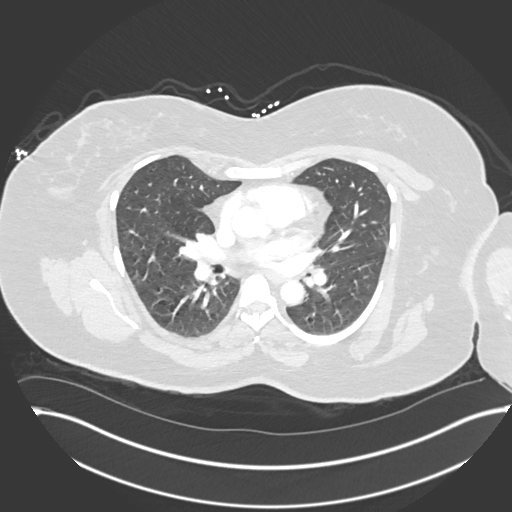
[im 152/259  lung]
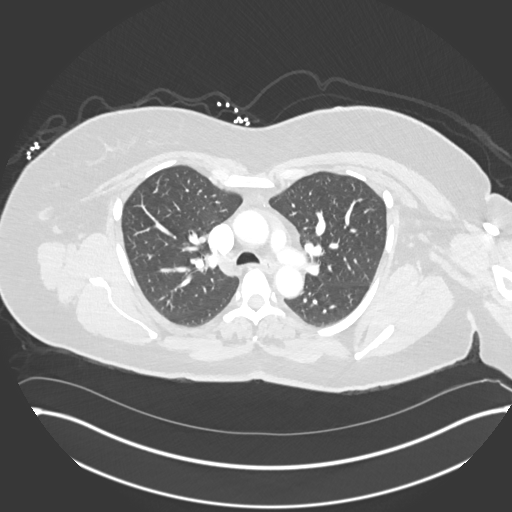
[im 183/259  lung]
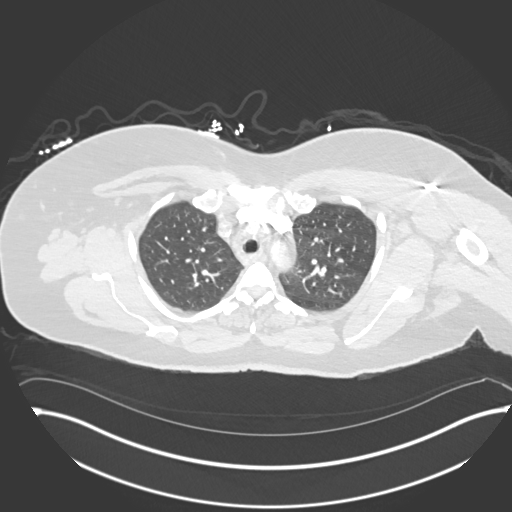
[im 213/259  lung]
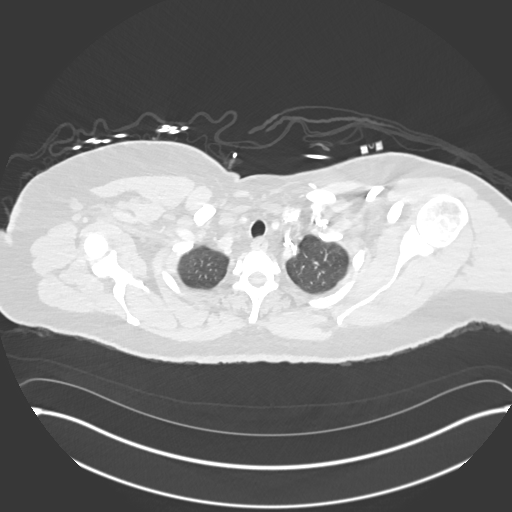
[im 243/259  lung]
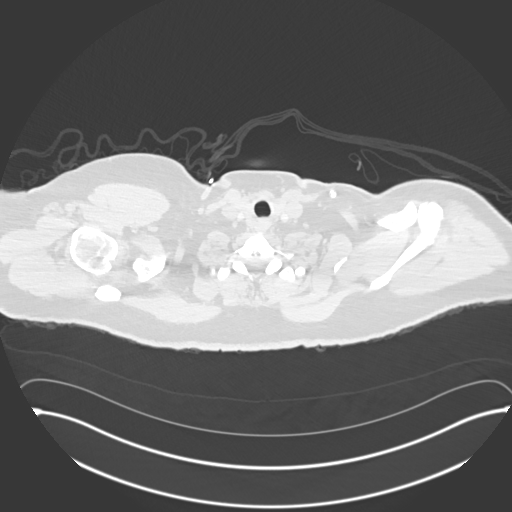

[Series 6: lung · axial · 0.89mm/px · z∈[+1279,+1423]mm · 4 of 81 slices shown]
[im 17/81  soft-tissue]
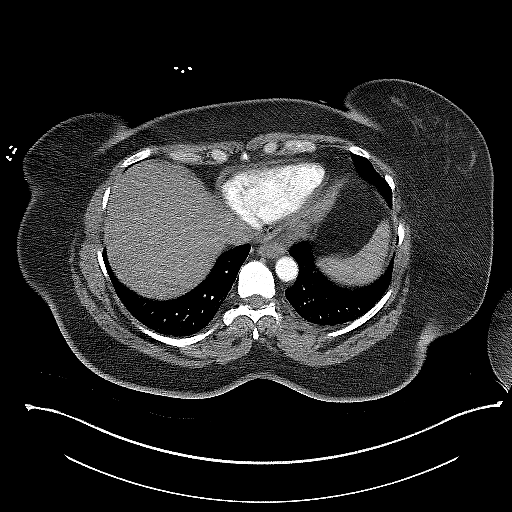
[im 33/81  soft-tissue]
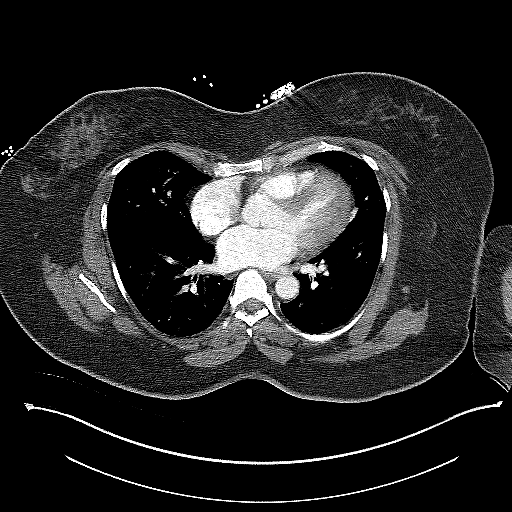
[im 49/81  soft-tissue]
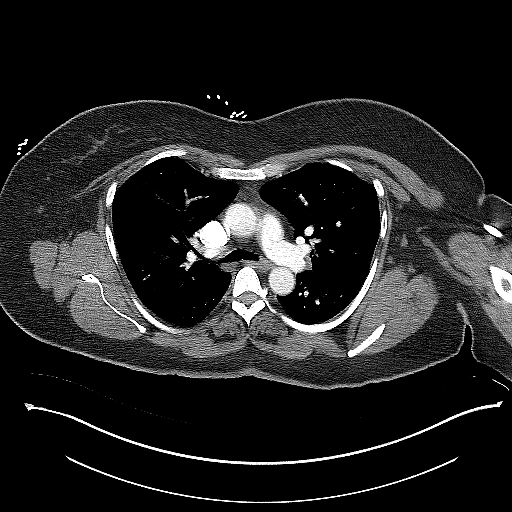
[im 65/81  soft-tissue]
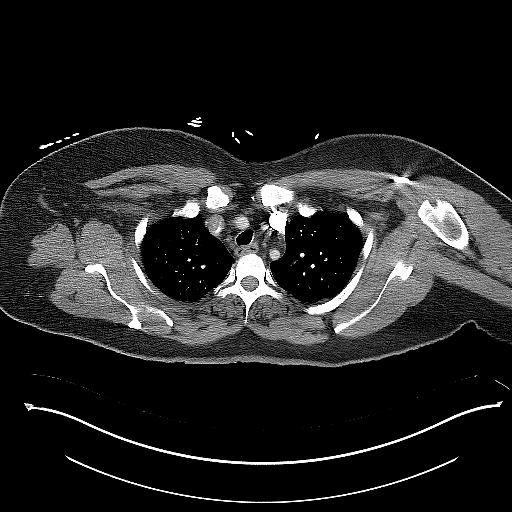

[Series 7: coronal mpr · coronal · 0.58mm/px · 3 of 151 slices shown]
[im 38/151  soft-tissue]
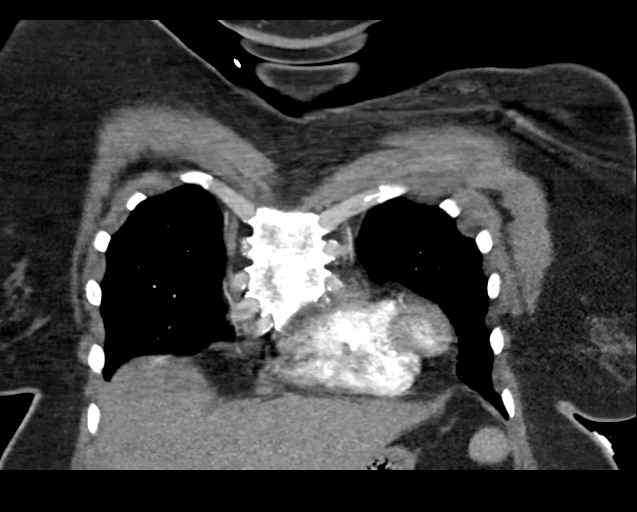
[im 76/151  soft-tissue]
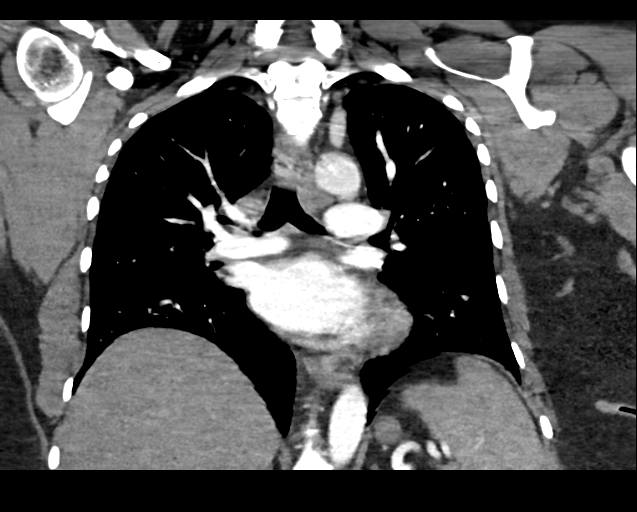
[im 113/151  soft-tissue]
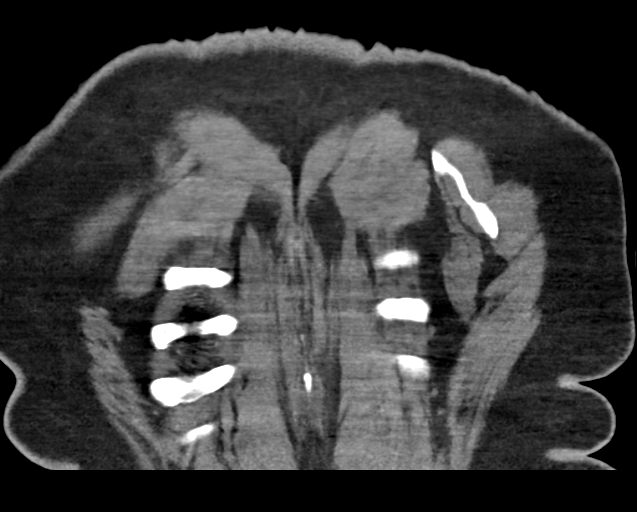

[19 of 46 positions shown; findings below may reference images not displayed]

FINDINGS: Lungs are adequately inflated without consolidation or effusion.
Airways are within normal.

Minimal cardiomegaly. Mild pectus excavatum deformity. No evidence
of pulmonary embolism. No mediastinal, hilar or axillary adenopathy.
Remaining mediastinal structures are within normal.

Images through the upper abdomen demonstrate a small lipoma over the
dome of the liver. Remainder the exam is within normal.

Review of the MIP images confirms the above findings.
IMPRESSION: No acute cardiopulmonary disease. No evidence of pulmonary embolism.

Mild cardiomegaly.  Mild pectus excavatum deformity.

## 2016-09-12 ENCOUNTER — Ambulatory Visit (INDEPENDENT_AMBULATORY_CARE_PROVIDER_SITE_OTHER): Payer: BLUE CROSS/BLUE SHIELD | Admitting: Adult Health

## 2016-09-12 ENCOUNTER — Encounter: Payer: Self-pay | Admitting: Adult Health

## 2016-09-12 VITALS — BP 146/86 | Temp 98.1°F | Ht 66.0 in | Wt 298.8 lb

## 2016-09-12 DIAGNOSIS — R300 Dysuria: Secondary | ICD-10-CM

## 2016-09-12 DIAGNOSIS — J029 Acute pharyngitis, unspecified: Secondary | ICD-10-CM

## 2016-09-12 LAB — POCT URINALYSIS DIPSTICK
Bilirubin, UA: NEGATIVE
Blood, UA: NEGATIVE
GLUCOSE UA: NEGATIVE
Ketones, UA: NEGATIVE
LEUKOCYTES UA: NEGATIVE
NITRITE UA: NEGATIVE
PROTEIN UA: NEGATIVE
Spec Grav, UA: 1.015 (ref 1.010–1.025)
UROBILINOGEN UA: 0.2 U/dL
pH, UA: 6 (ref 5.0–8.0)

## 2016-09-12 LAB — POCT RAPID STREP A (OFFICE): RAPID STREP A SCREEN: NEGATIVE

## 2016-09-12 MED ORDER — CEPHALEXIN 500 MG PO CAPS: 500.0000 mg | ORAL_CAPSULE | Freq: Two times a day (BID) | ORAL | 0 refills | Status: AC

## 2016-09-12 NOTE — Progress Notes (Signed)
Subjective:    Patient ID: Krystal Roberson, female    DOB: June 29, 1975, 41 y.o.   MRN: [id number]  HPI  41 year old female, patient of Dr. Clent RidgesFry who presents to the office today today with multiple complaints.   1. UTI symptoms She complains of 24 hours of dysuria, urgency, frequency, pelvic pressure, low grade fever and chills.    2. Sore throat - Three of her children have tested positive for strep over the last week . She reports sore throat x 24 hours, swollen tonsils, and swollen lymph nodes.    Review of Systems See HPI   Past Medical History:  Diagnosis Date  . Allergy   . Asthma   . Depression   . Headache(784.0)   . Migraine   . Thyroid disease   . UTI (lower urinary tract infection)     Social History   Social History  . Marital status: Married    Spouse name: N/A  . Number of children: N/A  . Years of education: N/A   Occupational History  . Not on file.   Social History Main Topics  . Smoking status: Never Smoker  . Smokeless tobacco: Never Used  . Alcohol use No  . Drug use: No  . Sexual activity: Not on file   Other Topics Concern  . Not on file   Social History Narrative  . No narrative on file    Past Surgical History:  Procedure Laterality Date  . CESAREAN SECTION    . CHOLECY[facility]Y  03-2014   at [facility]   . ENDOMETRIAL ABLATION W/ NOVASURE  2017   per Dr. Cherly Hensenousins     Family History  Problem Relation Age of Onset  . Arthritis Other   . Cancer Other     breast, 1st degree, less 50  . Diabetes Other     1st degree   . Hypertension Other   . Depression Other   . Stroke Other     1st degree, less 50  . Coronary artery disease Other       Current Outpatient Prescriptions on File Prior to Visit  Medication Sig Dispense Refill  . acetaminophen (TYLENOL) 500 MG tablet Take 1 tablet (500 mg total) by mouth every 6 (six) hours as needed for moderate pain. 30 tablet 0  . cholecalciferol (VITAMIN D) 1000 units tablet  Take 2,000 Units by mouth daily.     . ferrous sulfate 325 (65 FE) MG tablet Take 1 tablet (325 mg total) by mouth 3 (three) times daily with meals. (Patient taking differently: Take 325 mg by mouth daily with breakfast. ) 90 tablet 3  . levothyroxine (SYNTHROID) 200 MCG tablet Take 1 tablet (200 mcg total) by mouth daily before breakfast. 90 tablet 1  . topiramate (TOPAMAX) 25 MG tablet Take 1 tablet (25 mg total) by mouth 2 (two) times daily. 60 tablet 3   No current facility-administered medications on file prior to visit.     BP (!) 146/86 (BP Location: Left Arm, Patient Position: Sitting, Cuff Size: Normal)   Temp 98.1 F (36.7 C) (Oral)   Ht 5\' 6"  (1.676 m)   Wt 298 lb 12.8 oz (135.5 kg)   BMI 48.23 kg/m       Objective:   Physical Exam  Constitutional: She is oriented to person, place, and time. She appears well-developed and well-nourished. No distress.  HENT:  Right Ear: Hearing, tympanic membrane, external ear and ear canal normal.  Left Ear:  Hearing, tympanic membrane, external ear and ear canal normal.  Nose: Nose normal. No mucosal edema or rhinorrhea.  Mouth/Throat: Uvula is midline and mucous membranes are normal. Posterior oropharyngeal edema and posterior oropharyngeal erythema present.  Eyes: Conjunctivae and EOM are normal. Pupils are equal, round, and reactive to light. Right eye exhibits no discharge. Left eye exhibits no discharge.  Neck: Carotid bruit is not present.  Cardiovascular: Normal rate, regular rhythm, normal heart sounds and intact distal pulses.  Exam reveals no gallop and no friction rub.   No murmur heard. Pulmonary/Chest: Effort normal and breath sounds normal. No respiratory distress. She has no wheezes. She has no rales. She exhibits no tenderness.  Abdominal: Soft. Bowel sounds are normal. She exhibits no distension and no mass. There is no hepatosplenomegaly, splenomegaly or hepatomegaly. There is no tenderness. There is no rebound, no guarding  and no CVA tenderness. No hernia.  Neurological: She is alert and oriented to person, place, and time.  Skin: Skin is warm and dry. No rash noted. She is not diaphoretic. No erythema. No pallor.  Psychiatric: She has a normal mood and affect. Her behavior is normal. Judgment and thought content normal.  Nursing note and vitals reviewed.     Assessment & Plan:  1. Dysuria - Will cover with Keflex due to symptoms  - POCT urinalysis dipstick- negative  - Urine culture - cephALEXin (KEFLEX) 500 MG capsule; Take 1 capsule (500 mg total) by mouth 2 (two) times daily.  Dispense: 20 capsule; Refill: 0  2. Sore throat - Wil lcover with Keflex due to signs and symptoms - POC Rapid Strep A- negative  - Culture, Group A Strep - cephALEXin (KEFLEX) 500 MG capsule; Take 1 capsule (500 mg total) by mouth 2 (two) times daily.  Dispense: 20 capsule; Refill: 0 - Follow up as needed  Shirline Frees, NP

## 2016-09-14 LAB — CULTURE, GROUP A STREP

## 2016-09-14 LAB — URINE CULTURE

## 2016-09-20 ENCOUNTER — Ambulatory Visit: Payer: BLUE CROSS/BLUE SHIELD | Admitting: Neurology

## 2016-11-06 ENCOUNTER — Other Ambulatory Visit: Payer: Self-pay | Admitting: Neurology

## 2016-12-17 ENCOUNTER — Other Ambulatory Visit: Payer: Self-pay | Admitting: Neurology

## 2017-01-16 ENCOUNTER — Ambulatory Visit (INDEPENDENT_AMBULATORY_CARE_PROVIDER_SITE_OTHER): Payer: BLUE CROSS/BLUE SHIELD | Admitting: Family Medicine

## 2017-01-16 ENCOUNTER — Encounter: Payer: Self-pay | Admitting: Family Medicine

## 2017-01-16 VITALS — Temp 98.5°F | Ht 66.0 in | Wt 298.0 lb

## 2017-01-16 DIAGNOSIS — E039 Hypothyroidism, unspecified: Secondary | ICD-10-CM

## 2017-01-16 DIAGNOSIS — J209 Acute bronchitis, unspecified: Secondary | ICD-10-CM

## 2017-01-16 MED ORDER — AZITHROMYCIN 250 MG PO TABS: ORAL_TABLET | ORAL | 0 refills | Status: DC

## 2017-01-16 NOTE — Progress Notes (Signed)
   Subjective:    Patient ID: Krystal Roberson, female    DOB: 10/08/1975, 41 y.o.   MRN: [id number]  HPI Here for 3 days of chest tightness and a dry cough, body aches, and PND. No fever.    Review of Systems  Constitutional: Positive for fatigue. Negative for fever.  HENT: Positive for congestion and postnasal drip. Negative for sinus pain, sinus pressure and sore throat.   Eyes: Negative.   Respiratory: Positive for cough and chest tightness.        Objective:   Physical Exam  Constitutional: Krystal Roberson appears well-developed and well-nourished.  HENT:  Right Ear: External ear normal.  Left Ear: External ear normal.  Nose: Nose normal.  Mouth/Throat: Oropharynx is clear and moist.  Eyes: Conjunctivae are normal.  Neck: No thyromegaly present.  Pulmonary/Chest: Effort normal and breath sounds normal. No respiratory distress. Krystal Roberson has no wheezes. Krystal Roberson has no rales.  Lymphadenopathy:    Krystal Roberson has no cervical adenopathy.          Assessment & Plan:  Bronchitis, treat with a Zpack. Drink fluids and use Ibuprofen prn.  Gershon CraneStephen Mardelle Pandolfi, MD

## 2017-01-16 NOTE — Patient Instructions (Signed)
WE NOW OFFER   [facility] FAST TRACK!!!  SAME DAY Appointments for ACUTE CARE  Such as: Sprains, Injuries, cuts, abrasions, rashes, muscle pain, joint pain, back pain Colds, flu, sore throats, headache, allergies, cough, fever  Ear pain, sinus and eye infections Abdominal pain, nausea, vomiting, diarrhea, upset stomach Animal/insect bites  3 Easy Ways to Schedule: Walk-In Scheduling Call in scheduling Mychart Sign-up: [email or web address]         

## 2017-01-30 ENCOUNTER — Encounter: Payer: Self-pay | Admitting: Family Medicine

## 2017-02-11 ENCOUNTER — Other Ambulatory Visit: Payer: Self-pay | Admitting: Family Medicine

## 2017-02-23 ENCOUNTER — Other Ambulatory Visit: Payer: Self-pay | Admitting: Neurology

## 2017-05-08 ENCOUNTER — Ambulatory Visit: Payer: BLUE CROSS/BLUE SHIELD | Admitting: Adult Health

## 2017-05-09 ENCOUNTER — Ambulatory Visit: Payer: BLUE CROSS/BLUE SHIELD | Admitting: Adult Health

## 2017-05-12 ENCOUNTER — Other Ambulatory Visit (INDEPENDENT_AMBULATORY_CARE_PROVIDER_SITE_OTHER): Payer: BLUE CROSS/BLUE SHIELD

## 2017-05-12 ENCOUNTER — Other Ambulatory Visit: Payer: Self-pay

## 2017-05-12 DIAGNOSIS — E039 Hypothyroidism, unspecified: Secondary | ICD-10-CM

## 2017-05-12 LAB — T4, FREE: FREE T4: 0.59 ng/dL — AB (ref 0.60–1.60)

## 2017-05-12 LAB — TSH: TSH: 18.06 u[IU]/mL — ABNORMAL HIGH (ref 0.35–4.50)

## 2017-05-12 LAB — T3, FREE: T3, Free: 2.5 pg/mL (ref 2.3–4.2)

## 2017-05-15 ENCOUNTER — Ambulatory Visit: Payer: BLUE CROSS/BLUE SHIELD | Admitting: Family Medicine

## 2017-05-15 DIAGNOSIS — Z0289 Encounter for other administrative examinations: Secondary | ICD-10-CM

## 2017-05-19 ENCOUNTER — Encounter: Payer: Self-pay | Admitting: Family Medicine

## 2017-05-20 ENCOUNTER — Other Ambulatory Visit: Payer: Self-pay

## 2017-05-20 MED ORDER — LEVOTHYROXINE SODIUM 200 MCG PO TABS: 200.0000 ug | ORAL_TABLET | Freq: Every day | ORAL | 1 refills | Status: DC

## 2017-05-20 NOTE — Telephone Encounter (Signed)
We had discussed this previously on a Result Note. I would like to refer her to Endocrine if she agrees

## 2017-05-20 NOTE — Telephone Encounter (Signed)
Send in 200 mcg synthroid to [facility] in [place] 90 day supply. Rx sent pt advised.

## 2017-05-20 NOTE — Addendum Note (Signed)
Addended by: Gershon CraneFRY, Montanna Mcbain A on: 05/20/2017 11:46 AM   Modules accepted: Orders

## 2017-06-17 ENCOUNTER — Ambulatory Visit (INDEPENDENT_AMBULATORY_CARE_PROVIDER_SITE_OTHER): Payer: BLUE CROSS/BLUE SHIELD | Admitting: Endocrinology

## 2017-06-17 ENCOUNTER — Encounter: Payer: Self-pay | Admitting: Endocrinology

## 2017-06-17 VITALS — BP 126/101 | HR 99 | Wt 297.0 lb

## 2017-06-17 DIAGNOSIS — E039 Hypothyroidism, unspecified: Secondary | ICD-10-CM | POA: Diagnosis not present

## 2017-06-17 DIAGNOSIS — R635 Abnormal weight gain: Secondary | ICD-10-CM | POA: Diagnosis not present

## 2017-06-17 MED ORDER — DEXAMETHASONE 1 MG PO TABS: ORAL_TABLET | ORAL | 0 refills | Status: DC

## 2017-06-17 NOTE — Patient Instructions (Addendum)
Please do the 24-HR urine for "adrenaline."   Please also do the "dexamethasone suppression test."  for this, you would take dexamethasone 1 mg at 9-10 pm (i have sent a prescription to your pharmacy), then come in for a "cortisol" blood test the next morning before 9 am.  you do not need to be fasting for this test.  We'll recheck the thyroid at the same time.  It is best to never miss the thyroid pill.  However, if you miss a day, you can take 2 the next day.     Bariatric Surgery You have so much to gain by losing weight.  You may have already tried every diet and exercise plan imaginable.  And, you may have sought advice from your family physician, too.   Sometimes, in spite of such diligent efforts, you may not be able to achieve long-term results by yourself.  In cases of severe obesity, bariatric or weight loss surgery is a proven method of achieving long-term weight control.  Our Services Our bariatric surgery programs offer our patients new hope and long-term weight-loss solution.  Since introducing our services in 2003, we have conducted more than 2,400 successful procedures.  Our program is designated as a Investment banker, corporateComprehensive Center by the Metabolic and Bariatric Surgery Accreditation and Quality Improvement Program (MBSAQIP), a Child psychotherapistnational accrediting body that sets rigorous patient safety and outcome standards.  Our program is also designated as a Engineer, manufacturing systemsCenter of Excellence by [employer] insurance companies.   Our exceptional weight-loss surgery team specializes in diagnosis, treatment, follow-up care, and ongoing support for our patients with severe weight loss challenges.  We currently offer laparoscopic sleeve gastrectomy, gastric bypass, and adjustable gastric band (LAP-BAND).    Attend our Bariatrics Seminar Choosing to undergo a bariatric procedure is a big decision, and one that should not be taken lightly.  You now have two options in how you learn about weight-loss surgery - in person or online.  Our  objective is to ensure you have all of the information that you need to evaluate the advantages and obligations of this life changing procedure.  Please note that you are not alone in this process, and our experienced team is ready to assist and answer all of your questions.  There are several ways to register for a seminar (either on-line or in person): 1)  Call [phone] 2) Go on-line to [facility] and register for either type of seminar.  [email or web address]

## 2017-06-17 NOTE — Progress Notes (Signed)
Subjective:    Patient ID: Krystal Roberson, female    DOB: 11/05/1975, 42 y.o.   MRN: [id number]  HPI Pt returns for f/u of chronic primary hypothyroidism (dx'ed 1997; she has been on prescribed thyroid hormone therapy since then; she has never had thyroid imaging; she is not at risk for another pregnancy (she has had uterine ablation); synthroid rx has been complicated by noncompliance).  Main symptom is fatigue.  Past Medical History:  Diagnosis Date  . Allergy   . Asthma   . Depression   . Headache(784.0)   . Migraine   . Thyroid disease   . UTI (lower urinary tract infection)     Past Surgical History:  Procedure Laterality Date  . CESAREAN SECTION    . [facility]   . ENDOMETRIAL ABLATION W/ NOVASURE  2017   per Dr. Cherly Hensenousins     Social History   Socioeconomic History  . Marital status: Married    Spouse name: Not on file  . Number of children: Not on file  . Years of education: Not on file  . Highest education level: Not on file  Social Needs  . Financial resource strain: Not on file  . Food insecurity - worry: Not on file  . Food insecurity - inability: Not on file  . Transportation needs - medical: Not on file  . Transportation needs - non-medical: Not on file  Occupational History  . Not on file  Tobacco Use  . Smoking status: Never Smoker  . Smokeless tobacco: Never Used  Substance and Sexual Activity  . Alcohol use: No    Alcohol/week: 0.0 oz  . Drug use: No  . Sexual activity: Not on file  Other Topics Concern  . Not on file  Social History Narrative  . Not on file    Current Outpatient Medications on File Prior to Visit  Medication Sig Dispense Refill  . acetaminophen (TYLENOL) 500 MG tablet Take 1 tablet (500 mg total) by mouth every 6 (six) hours as needed for moderate pain. 30 tablet 0  . cholecalciferol (VITAMIN D) 1000 units tablet Take 5,000 Units by mouth daily.     . ferrous sulfate 325 (65 FE) MG  tablet Take 1 tablet (325 mg total) by mouth 3 (three) times daily with meals. (Patient taking differently: Take 325 mg by mouth daily with breakfast. ) 90 tablet 3  . levothyroxine (SYNTHROID) 200 MCG tablet Take 1 tablet (200 mcg total) by mouth daily before breakfast. 90 tablet 1   No current facility-administered medications on file prior to visit.       Family History  Problem Relation Age of Onset  . Arthritis Other   . Cancer Other        breast, 1st degree, less 50  . Diabetes Other        1st degree   . Hypertension Other   . Depression Other   . Stroke Other        1st degree, less 50  . Coronary artery disease Other     BP (!) 126/101 (BP Location: Left Wrist, Patient Position: Sitting, Cuff Size: Normal)   Pulse 99   Wt 297 lb (134.7 kg)   SpO2 96%   BMI 47.94 kg/m    Review of Systems She has muscle weakness    Objective:   Physical Exam VITAL SIGNS:  See vs page GENERAL: no distress NECK: There is no palpable thyroid enlargement.  No thyroid nodule is palpable.  No palpable lymphadenopathy at the anterior neck.       Assessment & Plan:  Hypothyroidism, due for recheck Weight gain.  I advised pt to do ON dex test.   Patient Instructions  Please do the 24-HR urine for "adrenaline."   Please also do the "dexamethasone suppression test."  for this, you would take dexamethasone 1 mg at 9-10 pm (i have sent a prescription to your pharmacy), then come in for a "cortisol" blood test the next morning before 9 am.  you do not need to be fasting for this test.  We'll recheck the thyroid at the same time.  It is best to never miss the thyroid pill.  However, if you miss a day, you can take 2 the next day.     Bariatric Surgery You have so much to gain by losing weight.  You may have already tried every diet and exercise plan imaginable.  And, you may have sought advice from your family physician, too.   Sometimes, in spite of such diligent efforts, you may not  be able to achieve long-term results by yourself.  In cases of severe obesity, bariatric or weight loss surgery is a proven method of achieving long-term weight control.  Our Services Our bariatric surgery programs offer our patients new hope and long-term weight-loss solution.  Since introducing our services in 2003, we have conducted more than 2,400 successful procedures.  Our program is designated as a Investment banker, corporate by the Metabolic and Bariatric Surgery Accreditation and Quality Improvement Program (MBSAQIP), a Child psychotherapist that sets rigorous patient safety and outcome standards.  Our program is also designated as a Engineer, manufacturing systems by [employer].   Our exceptional weight-loss surgery team specializes in diagnosis, treatment, follow-up care, and ongoing support for our patients with severe weight loss challenges.  We currently offer laparoscopic sleeve gastrectomy, gastric bypass, and adjustable gastric band (LAP-BAND).    Attend our Bariatrics Seminar Choosing to undergo a bariatric procedure is a big decision, and one that should not be taken lightly.  You now have two options in how you learn about weight-loss surgery - in person or online.  Our objective is to ensure you have all of the information that you need to evaluate the advantages and obligations of this life changing procedure.  Please note that you are not alone in this process, and our experienced team is ready to assist and answer all of your questions.  There are several ways to register for a seminar (either on-line or in person): 1)  Call [phone] 2) Go on-line to [facility] and register for either type of seminar.  [email or web address]

## 2017-09-22 ENCOUNTER — Other Ambulatory Visit: Payer: Self-pay

## 2017-09-22 ENCOUNTER — Ambulatory Visit (INDEPENDENT_AMBULATORY_CARE_PROVIDER_SITE_OTHER): Payer: BLUE CROSS/BLUE SHIELD | Admitting: Family Medicine

## 2017-09-22 ENCOUNTER — Encounter: Payer: Self-pay | Admitting: Family Medicine

## 2017-09-22 VITALS — BP 140/74 | HR 101 | Temp 98.5°F | Wt 294.4 lb

## 2017-09-22 DIAGNOSIS — H02401 Unspecified ptosis of right eyelid: Secondary | ICD-10-CM

## 2017-09-22 NOTE — Progress Notes (Signed)
   Subjective:    Patient ID: Krystal Roberson, female    DOB: [birth year], 42 y.o.   MRN: [id number]  HPI Here asking about referral to have her right upper eyelid tightened up. 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 She has regained total function of every other part of her face since then. The eyelid does interfere with her vision.    Review of Systems  Constitutional: Negative.   HENT: Negative.   Eyes: Positive for visual disturbance.  Respiratory: Negative.        Objective:   Physical Exam  Constitutional: She is oriented to person, place, and time. She appears well-developed and well-nourished.  HENT:  Right Ear: External ear normal.  Left Ear: External ear normal.  Nose: Nose normal.  Mouth/Throat: Oropharynx is clear and moist.  Eyes: Pupils are equal, round, and reactive to light. Conjunctivae and EOM are normal.  The right upper eyelid has a slight droop   Neck: No thyromegaly present.  Cardiovascular: Normal rate, regular rhythm, normal heart sounds and intact distal pulses.  Pulmonary/Chest: Effort normal and breath sounds normal.  Lymphadenopathy:    She has no cervical adenopathy.  Neurological: She is alert and oriented to person, place, and time. She exhibits normal muscle tone.          Assessment & Plan:  Ptosis, refer to Ophthalmology for a blepharoplasty. Gershon Crane, MD

## 2017-10-08 ENCOUNTER — Telehealth: Payer: Self-pay | Admitting: Family Medicine

## 2017-10-08 DIAGNOSIS — R29810 Facial weakness: Secondary | ICD-10-CM

## 2017-10-08 NOTE — Telephone Encounter (Signed)
The patietn was seen at [facility] by Dr. Council Mechanic, and she requested we draw acetylcholine receptor antibodies. This order was placed.

## 2017-10-08 NOTE — Telephone Encounter (Signed)
Called and spoke with pt. Pt advised and scheduled for lab appt on 5/31 @ 1:15 PM  Once done fax results to [phone] Dr. Council Mechanic MD @ [facility]

## 2017-10-10 ENCOUNTER — Other Ambulatory Visit (INDEPENDENT_AMBULATORY_CARE_PROVIDER_SITE_OTHER): Payer: BLUE CROSS/BLUE SHIELD

## 2017-10-10 DIAGNOSIS — R29810 Facial weakness: Secondary | ICD-10-CM | POA: Diagnosis not present

## 2017-10-17 LAB — ACETYLCHOLINE RECEPTOR AB, ALL
ACETYLCHOL BLOCK AB: 15 % (ref 0–25)
AChR Binding Ab, Serum: 0.06 nmol/L (ref 0.00–0.24)

## 2017-10-20 NOTE — Progress Notes (Signed)
Called pt and left a VM to call back. CRM created.  

## 2017-10-20 NOTE — Telephone Encounter (Signed)
Results are in and have been faxed.

## 2017-10-23 LAB — HM DIABETES EYE EXAM

## 2017-11-24 ENCOUNTER — Other Ambulatory Visit: Payer: Self-pay | Admitting: Family Medicine

## 2018-02-02 ENCOUNTER — Ambulatory Visit (INDEPENDENT_AMBULATORY_CARE_PROVIDER_SITE_OTHER): Payer: BLUE CROSS/BLUE SHIELD | Admitting: Family Medicine

## 2018-02-02 ENCOUNTER — Encounter: Payer: Self-pay | Admitting: Family Medicine

## 2018-02-02 VITALS — BP 136/78 | HR 98 | Temp 98.8°F | Wt 296.1 lb

## 2018-02-02 DIAGNOSIS — J209 Acute bronchitis, unspecified: Secondary | ICD-10-CM | POA: Diagnosis not present

## 2018-02-02 MED ORDER — AZITHROMYCIN 250 MG PO TABS: ORAL_TABLET | ORAL | 0 refills | Status: DC

## 2018-02-02 NOTE — Progress Notes (Signed)
   Subjective:    Patient ID: Krystal Roberson, female    DOB: [birth year], 42 y.o.   MRN: [id number]  HPI Here for several days of fever, chest tightness and a dry cough. On Ibuprofen.    Review of Systems  Constitutional: Negative.   HENT: Positive for congestion. Negative for sinus pressure, sinus pain and sore throat.   Eyes: Negative.   Respiratory: Positive for cough and chest tightness. Negative for shortness of breath and wheezing.        Objective:   Physical Exam  Constitutional: She appears well-developed and well-nourished.  HENT:  Right Ear: External ear normal.  Left Ear: External ear normal.  Nose: Nose normal.  Mouth/Throat: Oropharynx is clear and moist.  Eyes: Conjunctivae are normal.  Neck: No thyromegaly present.  Cardiovascular: Normal rate, regular rhythm, normal heart sounds and intact distal pulses.  Pulmonary/Chest: Effort normal and breath sounds normal. No stridor. No respiratory distress. She has no wheezes. She has no rales.  Lymphadenopathy:    She has no cervical adenopathy.          Assessment & Plan:  Bronchitis, treat with a Zpack. Gershon CraneStephen Fry, MD

## 2018-04-29 ENCOUNTER — Encounter: Payer: Self-pay | Admitting: Family Medicine

## 2018-04-29 ENCOUNTER — Ambulatory Visit (INDEPENDENT_AMBULATORY_CARE_PROVIDER_SITE_OTHER): Payer: BLUE CROSS/BLUE SHIELD | Admitting: Family Medicine

## 2018-04-29 VITALS — BP 126/90 | HR 99 | Temp 98.0°F | Wt 283.2 lb

## 2018-04-29 DIAGNOSIS — F4321 Adjustment disorder with depressed mood: Secondary | ICD-10-CM | POA: Diagnosis not present

## 2018-04-29 MED ORDER — LORAZEPAM 0.5 MG PO TABS: 0.5000 mg | ORAL_TABLET | Freq: Four times a day (QID) | ORAL | 1 refills | Status: DC | PRN

## 2018-04-29 NOTE — Progress Notes (Signed)
   Subjective:    Patient ID: Krystal Roberson, female    DOB: 1975/06/19, 42 y.o.   MRN: [id number]  HPI Here asking for help with anxiety and a grief reaction. Her husband had been dealing with a host of serious medical problems and he had been getting dialysis 3 times a week. About 6 weeks ago he began to fail, and then about 3 weeks ago he passed away suddenly at home. This has been very difficult for her and her teenage children. 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 She feels sad, tearful, anxious, and overwhelmed at times. She has attended one meeting of a grief support group, and that was helpful. She has trouble sleeping and eating. She says she does not want to take any "depression medications" because in the past Zoloft made her suicidal and Wellbutrin did not help. She asks for something to help her relax.    Review of Systems  Constitutional: Negative.   Respiratory: Negative.   Cardiovascular: Negative.   Psychiatric/Behavioral: Positive for dysphoric mood and sleep disturbance. Negative for agitation and confusion. The patient is nervous/anxious.        Objective:   Physical Exam Constitutional:      Appearance: Normal appearance.  Cardiovascular:     Rate and Rhythm: Normal rate and regular rhythm.     Pulses: Normal pulses.     Heart sounds: Normal heart sounds.  Pulmonary:     Effort: Pulmonary effort is normal.     Breath sounds: Normal breath sounds.  Neurological:     Mental Status: She is alert.  Psychiatric:        Thought Content: Thought content normal.        Judgment: Judgment normal.     Comments: Tearful            Assessment & Plan:  Grief reaction. She will try Lorazepam to use as needed, especially at bedtime. I encouraged her to continue going to the support meetings. 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 We spent 40 minutes together discussing these issues.  Gershon CraneStephen Aamari Strawderman, MD

## 2018-05-21 ENCOUNTER — Ambulatory Visit (INDEPENDENT_AMBULATORY_CARE_PROVIDER_SITE_OTHER): Payer: BLUE CROSS/BLUE SHIELD | Admitting: Family Medicine

## 2018-05-21 ENCOUNTER — Encounter: Payer: Self-pay | Admitting: Family Medicine

## 2018-05-21 VITALS — BP 128/82 | HR 96 | Temp 97.9°F | Wt 279.5 lb

## 2018-05-21 DIAGNOSIS — E039 Hypothyroidism, unspecified: Secondary | ICD-10-CM | POA: Diagnosis not present

## 2018-05-21 DIAGNOSIS — F4321 Adjustment disorder with depressed mood: Secondary | ICD-10-CM

## 2018-05-21 LAB — CBC WITH DIFFERENTIAL/PLATELET
Basophils Absolute: 0 10*3/uL (ref 0.0–0.1)
Basophils Relative: 0.4 % (ref 0.0–3.0)
Eosinophils Absolute: 0.2 10*3/uL (ref 0.0–0.7)
Eosinophils Relative: 2.6 % (ref 0.0–5.0)
HCT: 39.9 % (ref 36.0–46.0)
Hemoglobin: 13.1 g/dL (ref 12.0–15.0)
LYMPHS ABS: 1.5 10*3/uL (ref 0.7–4.0)
Lymphocytes Relative: 17.1 % (ref 12.0–46.0)
MCHC: 32.8 g/dL (ref 30.0–36.0)
MCV: 85.9 fl (ref 78.0–100.0)
Monocytes Absolute: 0.4 10*3/uL (ref 0.1–1.0)
Monocytes Relative: 4.4 % (ref 3.0–12.0)
NEUTROS ABS: 6.5 10*3/uL (ref 1.4–7.7)
Neutrophils Relative %: 75.5 % (ref 43.0–77.0)
PLATELETS: 254 10*3/uL (ref 150.0–400.0)
RBC: 4.65 Mil/uL (ref 3.87–5.11)
RDW: 15.6 % — ABNORMAL HIGH (ref 11.5–15.5)
WBC: 8.6 10*3/uL (ref 4.0–10.5)

## 2018-05-21 LAB — T4, FREE: Free T4: 1.12 ng/dL (ref 0.60–1.60)

## 2018-05-21 LAB — HEPATIC FUNCTION PANEL
ALBUMIN: 4 g/dL (ref 3.5–5.2)
ALK PHOS: 94 U/L (ref 39–117)
ALT: 19 U/L (ref 0–35)
AST: 15 U/L (ref 0–37)
BILIRUBIN DIRECT: 0.1 mg/dL (ref 0.0–0.3)
BILIRUBIN TOTAL: 0.4 mg/dL (ref 0.2–1.2)
Total Protein: 6.8 g/dL (ref 6.0–8.3)

## 2018-05-21 LAB — T3, FREE: T3, Free: 3.6 pg/mL (ref 2.3–4.2)

## 2018-05-21 LAB — BASIC METABOLIC PANEL
BUN: 19 mg/dL (ref 6–23)
CALCIUM: 9.3 mg/dL (ref 8.4–10.5)
CO2: 27 mEq/L (ref 19–32)
Chloride: 104 mEq/L (ref 96–112)
Creatinine, Ser: 0.72 mg/dL (ref 0.40–1.20)
GFR: 93.5 mL/min (ref >60.00)
GLUCOSE: 100 mg/dL — AB (ref 70–99)
Potassium: 4.6 mEq/L (ref 3.5–5.1)
SODIUM: 139 meq/L (ref 135–145)

## 2018-05-21 LAB — LIPID PANEL
CHOLESTEROL: 139 mg/dL (ref 0–200)
HDL: 32.3 mg/dL — ABNORMAL LOW (ref >39.00)
LDL CALC: 88 mg/dL (ref 0–99)
NONHDL: 107.12
Total CHOL/HDL Ratio: 4
Triglycerides: 95 mg/dL (ref 0.0–149.0)
VLDL: 19 mg/dL (ref 0.0–40.0)

## 2018-05-21 LAB — TSH: TSH: 0.18 u[IU]/mL — ABNORMAL LOW (ref 0.35–4.50)

## 2018-05-21 NOTE — Progress Notes (Signed)
   Subjective:    Patient ID: Krystal Roberson, female    DOB: 1975/06/30, 43 y.o.   MRN: [id number]  HPI Here to follow up on her grieving process after she lost her husband several months ago. She has done fairly well but it has been difficult. She uses Lorazepam at night to help with sleep. 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    Review of Systems  Constitutional: Negative.   Respiratory: Negative.   Cardiovascular: Negative.   Neurological: Negative.   Psychiatric/Behavioral: Positive for dysphoric mood. The patient is nervous/anxious.        Objective:   Physical Exam Constitutional:      Appearance: Normal appearance.  Cardiovascular:     Rate and Rhythm: Normal rate and regular rhythm.     Pulses: Normal pulses.     Heart sounds: Normal heart sounds.  Pulmonary:     Effort: Pulmonary effort is normal.     Breath sounds: Normal breath sounds.  Neurological:     General: No focal deficit present.     Mental Status: She is alert and oriented to person, place, and time.           Assessment & Plan:  She is working on the grief process. Check labs today. We will write her out of work until 06-15-18. Gershon Crane, MD

## 2018-05-22 ENCOUNTER — Encounter: Payer: Self-pay | Admitting: *Deleted

## 2018-07-14 ENCOUNTER — Encounter: Payer: Self-pay | Admitting: Family Medicine

## 2018-07-14 ENCOUNTER — Ambulatory Visit (INDEPENDENT_AMBULATORY_CARE_PROVIDER_SITE_OTHER): Payer: BLUE CROSS/BLUE SHIELD | Admitting: Family Medicine

## 2018-07-14 VITALS — BP 118/70 | HR 85 | Temp 98.7°F | Ht 66.0 in | Wt 264.4 lb

## 2018-07-14 DIAGNOSIS — F4321 Adjustment disorder with depressed mood: Secondary | ICD-10-CM | POA: Diagnosis not present

## 2018-07-14 NOTE — Progress Notes (Signed)
   Subjective:    Patient ID: Krystal Roberson, female    DOB: 25-Mar-1976, 43 y.o.   MRN: [id number]  HPI Here to follow up on a grief reaction. Her husband passed away and she has been out of work since 04-08-18. She has been attending a grief group once a week. She feels she cannot return to work yet because her job is stressful and often requires working up to 60 hours a week. So far her boss has been granting her comp time but she has now said Tristy should file for [employer]. She has these forms at home and asks if we can fill these out for her.    Review of Systems  Constitutional: Negative.   Respiratory: Negative.   Cardiovascular: Negative.   Neurological: Negative.   Psychiatric/Behavioral: Positive for dysphoric mood.       Objective:   Physical Exam Constitutional:      Appearance: Normal appearance.  Cardiovascular:     Rate and Rhythm: Normal rate and regular rhythm.     Pulses: Normal pulses.     Heart sounds: Normal heart sounds.  Pulmonary:     Effort: Pulmonary effort is normal.     Breath sounds: Normal breath sounds.  Neurological:     General: No focal deficit present.     Mental Status: She is alert. Mental status is at baseline.  Psychiatric:        Thought Content: Thought content normal.        Judgment: Judgment normal.     Comments: Affect is depressed but eye contact is good and she can smile            Assessment & Plan:  Grief reaction she will forward [place] the FMLA forms to fill out. We will aim for a target date to return to work on 08-17-18. I also encouraged her to file for short term disability to help with finances. I encouraged her to get exercise daily.  Gershon Crane, MD

## 2018-07-22 ENCOUNTER — Encounter: Payer: Self-pay | Admitting: Family Medicine

## 2018-07-22 DIAGNOSIS — F4329 Adjustment disorder with other symptoms: Secondary | ICD-10-CM

## 2018-07-22 DIAGNOSIS — F4381 Prolonged grief disorder: Secondary | ICD-10-CM

## 2018-07-22 NOTE — Telephone Encounter (Signed)
Forms have been placed in the red folder for completion.  thanks

## 2018-07-24 DIAGNOSIS — Z0279 Encounter for issue of other medical certificate: Secondary | ICD-10-CM

## 2018-07-24 DIAGNOSIS — F4329 Adjustment disorder with other symptoms: Secondary | ICD-10-CM | POA: Insufficient documentation

## 2018-07-24 DIAGNOSIS — F4381 Prolonged grief disorder: Secondary | ICD-10-CM | POA: Insufficient documentation

## 2018-07-24 NOTE — Telephone Encounter (Signed)
The forms are ready  

## 2018-12-01 ENCOUNTER — Other Ambulatory Visit: Payer: Self-pay | Admitting: Family Medicine

## 2019-03-30 DIAGNOSIS — R1900 Intra-abdominal and pelvic swelling, mass and lump, unspecified site: Secondary | ICD-10-CM | POA: Insufficient documentation

## 2019-04-02 HISTORY — PX: ABDOMINAL HYSTERECTOMY: SHX81

## 2019-06-10 ENCOUNTER — Other Ambulatory Visit: Payer: Self-pay | Admitting: Family Medicine

## 2019-07-05 ENCOUNTER — Other Ambulatory Visit: Payer: Self-pay | Admitting: Family Medicine

## 2019-07-27 ENCOUNTER — Encounter: Payer: Self-pay | Admitting: Family Medicine

## 2019-07-27 ENCOUNTER — Other Ambulatory Visit: Payer: Self-pay

## 2019-07-27 ENCOUNTER — Ambulatory Visit (INDEPENDENT_AMBULATORY_CARE_PROVIDER_SITE_OTHER): Payer: Self-pay | Admitting: Family Medicine

## 2019-07-27 VITALS — BP 118/56 | HR 85 | Temp 97.7°F | Ht 66.0 in | Wt 246.4 lb

## 2019-07-27 DIAGNOSIS — F418 Other specified anxiety disorders: Secondary | ICD-10-CM

## 2019-07-27 MED ORDER — BUPROPION HCL ER (XL) 150 MG PO TB24: 150.0000 mg | ORAL_TABLET | Freq: Every day | ORAL | 2 refills | Status: DC

## 2019-07-27 MED ORDER — ALPRAZOLAM 0.5 MG PO TABS: 0.5000 mg | ORAL_TABLET | Freq: Every evening | ORAL | 2 refills | Status: DC | PRN

## 2019-07-27 NOTE — Progress Notes (Signed)
   Subjective:    Patient ID: Krystal Roberson, female    DOB: June 24, 1975, 44 y.o.   MRN: [id number]  HPI Here asking for help with depression and anxiety. 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 She is also dealing with surgical menopausal symptoms like mood swings and hot flashes. She recently had a complete hysterectomy because a worrisome abnormality was seen on a scan involving her right ovary. Pathology identified this as endometriosis fortunately, not neoplasm. She is seeing Dr. Theodosia Quay for this and they are currently treating her with Prempro. She has feelings of depression with sadness, hopelessness, and decreased energy,. She also has a lot of worrying and trouble sleeping.    Review of Systems  Constitutional: Negative.   Respiratory: Negative.   Cardiovascular: Negative.   Neurological: Negative.   Psychiatric/Behavioral: Positive for dysphoric mood and sleep disturbance. Negative for agitation, behavioral problems, confusion, decreased concentration, hallucinations, self-injury and suicidal ideas. The patient is nervous/anxious.        Objective:   Physical Exam Constitutional:      Appearance: Normal appearance.  Cardiovascular:     Rate and Rhythm: Normal rate and regular rhythm.     Pulses: Normal pulses.     Heart sounds: Normal heart sounds.  Pulmonary:     Effort: Pulmonary effort is normal.     Breath sounds: Normal breath sounds.  Neurological:     General: No focal deficit present.     Mental Status: She is alert and oriented to person, place, and time.  Psychiatric:        Mood and Affect: Mood normal.        Behavior: Behavior normal.        Thought Content: Thought content normal.        Judgment: Judgment normal.           Assessment & Plan:  Depression with anxiety. She will start on Wellbutrin XL 150 mg each morning and also take Xanax 0.5 mg at  bedtime. Recheck in 3 weeks. We spent 45 minutes together discussing these issues.  Gershon Crane, MD

## 2019-07-28 LAB — HEPATIC FUNCTION PANEL
ALT: 15 U/L (ref 0–35)
AST: 16 U/L (ref 0–37)
Albumin: 4.2 g/dL (ref 3.5–5.2)
Alkaline Phosphatase: 102 U/L (ref 39–117)
Bilirubin, Direct: 0.1 mg/dL (ref 0.0–0.3)
Total Bilirubin: 0.3 mg/dL (ref 0.2–1.2)
Total Protein: 7.4 g/dL (ref 6.0–8.3)

## 2019-07-28 LAB — BASIC METABOLIC PANEL
BUN: 25 mg/dL — ABNORMAL HIGH (ref 6–23)
CO2: 30 mEq/L (ref 19–32)
Calcium: 9.7 mg/dL (ref 8.4–10.5)
Chloride: 102 mEq/L (ref 96–112)
Creatinine, Ser: 1.03 mg/dL (ref 0.40–1.20)
GFR: 57.88 mL/min — ABNORMAL LOW (ref >60.00)
Glucose, Bld: 95 mg/dL (ref 70–99)
Potassium: 4.4 mEq/L (ref 3.5–5.1)
Sodium: 139 mEq/L (ref 135–145)

## 2019-07-28 LAB — CBC WITH DIFFERENTIAL/PLATELET
Basophils Absolute: 0.1 10*3/uL (ref 0.0–0.1)
Basophils Relative: 0.8 % (ref 0.0–3.0)
Eosinophils Absolute: 0.3 10*3/uL (ref 0.0–0.7)
Eosinophils Relative: 3.6 % (ref 0.0–5.0)
HCT: 38.9 % (ref 36.0–46.0)
Hemoglobin: 12.9 g/dL (ref 12.0–15.0)
Lymphocytes Relative: 23 % (ref 12.0–46.0)
Lymphs Abs: 2 10*3/uL (ref 0.7–4.0)
MCHC: 33.2 g/dL (ref 30.0–36.0)
MCV: 87.5 fl (ref 78.0–100.0)
Monocytes Absolute: 0.5 10*3/uL (ref 0.1–1.0)
Monocytes Relative: 5.3 % (ref 3.0–12.0)
Neutro Abs: 6 10*3/uL (ref 1.4–7.7)
Neutrophils Relative %: 67.3 % (ref 43.0–77.0)
Platelets: 250 10*3/uL (ref 150.0–400.0)
RBC: 4.45 Mil/uL (ref 3.87–5.11)
RDW: 13.5 % (ref 11.5–15.5)
WBC: 8.9 10*3/uL (ref 4.0–10.5)

## 2019-07-28 LAB — TSH: TSH: <0.01 u[IU]/mL — ABNORMAL LOW (ref 0.35–4.50)

## 2019-07-28 LAB — T3, FREE: T3, Free: 3.6 pg/mL (ref 2.3–4.2)

## 2019-07-28 LAB — T4, FREE: Free T4: 1.5 ng/dL (ref 0.60–1.60)

## 2019-08-06 ENCOUNTER — Other Ambulatory Visit: Payer: Self-pay

## 2019-08-06 ENCOUNTER — Telehealth: Payer: Self-pay | Admitting: Family Medicine

## 2019-08-06 ENCOUNTER — Encounter (HOSPITAL_COMMUNITY): Payer: Self-pay | Admitting: Pediatrics

## 2019-08-06 ENCOUNTER — Encounter (HOSPITAL_COMMUNITY): Payer: Self-pay | Admitting: Emergency Medicine

## 2019-08-06 ENCOUNTER — Emergency Department (HOSPITAL_COMMUNITY)
Admission: EM | Admit: 2019-08-06 | Discharge: 2019-08-06 | Disposition: A | Discharge status: Home / Self Care | Payer: Self-pay | Attending: Emergency Medicine | Admitting: Emergency Medicine

## 2019-08-06 ENCOUNTER — Emergency Department (HOSPITAL_COMMUNITY)
Admission: EM | Admit: 2019-08-06 | Discharge: 2019-08-07 | Disposition: A | Discharge status: Home / Self Care | Payer: Self-pay | Attending: Emergency Medicine | Admitting: Emergency Medicine

## 2019-08-06 DIAGNOSIS — Z79899 Other long term (current) drug therapy: Secondary | ICD-10-CM | POA: Insufficient documentation

## 2019-08-06 DIAGNOSIS — J45909 Unspecified asthma, uncomplicated: Secondary | ICD-10-CM | POA: Insufficient documentation

## 2019-08-06 DIAGNOSIS — Z5321 Procedure and treatment not carried out due to patient leaving prior to being seen by health care provider: Secondary | ICD-10-CM | POA: Insufficient documentation

## 2019-08-06 DIAGNOSIS — H02401 Unspecified ptosis of right eyelid: Secondary | ICD-10-CM | POA: Insufficient documentation

## 2019-08-06 DIAGNOSIS — H538 Other visual disturbances: Secondary | ICD-10-CM | POA: Insufficient documentation

## 2019-08-06 LAB — COMPREHENSIVE METABOLIC PANEL
ALT: 19 U/L (ref 0–44)
AST: 17 U/L (ref 15–41)
Albumin: 3.8 g/dL (ref 3.5–5.0)
Alkaline Phosphatase: 90 U/L (ref 38–126)
Anion gap: 11 (ref 5–15)
BUN: 15 mg/dL (ref 6–20)
CO2: 24 mmol/L (ref 22–32)
Calcium: 9.2 mg/dL (ref 8.9–10.3)
Chloride: 104 mmol/L (ref 98–111)
Creatinine, Ser: 0.62 mg/dL (ref 0.44–1.00)
GFR calc Af Amer: >60 mL/min (ref >60)
GFR calc non Af Amer: >60 mL/min (ref >60)
Glucose, Bld: 98 mg/dL (ref 70–99)
Potassium: 4.3 mmol/L (ref 3.5–5.1)
Sodium: 139 mmol/L (ref 135–145)
Total Bilirubin: 0.7 mg/dL (ref 0.3–1.2)
Total Protein: 7.2 g/dL (ref 6.5–8.1)

## 2019-08-06 LAB — CBC WITH DIFFERENTIAL/PLATELET
Abs Immature Granulocytes: 0.02 10*3/uL (ref 0.00–0.07)
Basophils Absolute: 0 10*3/uL (ref 0.0–0.1)
Basophils Relative: 1 %
Eosinophils Absolute: 0.3 10*3/uL (ref 0.0–0.5)
Eosinophils Relative: 3 %
HCT: 40 % (ref 36.0–46.0)
Hemoglobin: 12.7 g/dL (ref 12.0–15.0)
Immature Granulocytes: 0 %
Lymphocytes Relative: 24 %
Lymphs Abs: 1.8 10*3/uL (ref 0.7–4.0)
MCH: 28.4 pg (ref 26.0–34.0)
MCHC: 31.8 g/dL (ref 30.0–36.0)
MCV: 89.5 fL (ref 80.0–100.0)
Monocytes Absolute: 0.5 10*3/uL (ref 0.1–1.0)
Monocytes Relative: 6 %
Neutro Abs: 5 10*3/uL (ref 1.7–7.7)
Neutrophils Relative %: 66 %
Platelets: 221 10*3/uL (ref 150–400)
RBC: 4.47 MIL/uL (ref 3.87–5.11)
RDW: 13.2 % (ref 11.5–15.5)
WBC: 7.6 10*3/uL (ref 4.0–10.5)
nRBC: 0 % (ref 0.0–0.2)

## 2019-08-06 NOTE — ED Triage Notes (Signed)
Patient stated changes in her left eye vision and droop on left lid. Reported has history of cosmetic change on left eye lid done 07/2017; patient concern stroke symptoms due to vision changes started yesterday approx around 1700,

## 2019-08-06 NOTE — ED Notes (Signed)
Pt. Stated to this NT that her left eye was now "twitching and weak"

## 2019-08-06 NOTE — ED Triage Notes (Addendum)
Patient states her left eye vision and droop on left lid. Patient has history of cosmetic change on left eye lid done 07/2017. Patient was seen at [facility] and [facility] pcp.

## 2019-08-06 NOTE — Telephone Encounter (Signed)
Pt called about trouble that she is having with medication Wellbutrin. She explained that she is experiencing eye issues drooping and twitching. She woke up feeling bad this morning and having suicidal thoughts irrational thoughts.    I transferred pt to nurse triage.

## 2019-08-06 NOTE — Telephone Encounter (Signed)
Noted will await Nurse Triage notes

## 2019-08-07 LAB — TSH: TSH: 0.012 u[IU]/mL — ABNORMAL LOW (ref 0.350–4.500)

## 2019-08-07 NOTE — ED Provider Notes (Signed)
[facility]-EMERGENCY DEPT Provider Note   CSN: [id number] Arrival date & time: 08/06/19  2201     History Chief Complaint  Patient presents with  . Eye Problem    Krystal Roberson is a 44 y.o. female.  44 year old female with a history of migraines, thyroid disease, asthma, depression presents to the emergency department for evaluation of ptosis of her R upper eyelid. She does have hx of right sided Bell's palsy and underwent blepharoplasty in 2019 for cosmesis. Patient does wear glasses or contacts. No new prescriptions. Feels that her eyes are "weak" and "tired", aggravated by movement. She has also noted some intermittent, bilateral blurry vision which can affect one eye at a time rather than both simultaneously. No complete vision loss, recent viral illness, fever, headache, extremity numbness or paresthesias, extremity weakness, tinnitus, hearing loss, difficulty speaking or swallowing, head trauma.  The history is provided by the patient. No language interpreter was used.  Eye Problem      Past Medical History:  Diagnosis Date  . Allergy   . Asthma   . Depression   . Headache(784.0)   . Migraine   . Thyroid disease   . UTI (lower urinary tract infection)     Patient Active Problem List   Diagnosis Date Noted  . Depression with anxiety 07/27/2019  . 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 Weight gain 10/19/2015  . Symptomatic anemia 03/27/2015  . Menorrhagia 03/27/2015  . Heart palpitations 03/22/2015  . Palpitations 03/10/2015  . VARICOSE VEINS, LOWER EXTREMITIES 05/21/2010  . Hypothyroidism 02/13/2007  . ASTHMA 02/13/2007  . HEADACHE 02/13/2007    Past Surgical History:  Procedure Laterality Date  . ABDOMINAL HYSTERECTOMY  04/01/2020   Laproscopic  . CESAREAN SECTION    . CHOLECYSTECTOMY  03-2014   at [facility]   . ENDOMETRIAL ABLATION W/ NOVASURE  2017   per Dr. Cherly Hensen      OB History   No obstetric history on file.     Family History  Problem Relation Age of Onset  . Arthritis Other   . Cancer Other        breast, 1st degree, less 50  . Diabetes Other        1st degree   . Hypertension Other   . Depression Other   . Stroke Other        1st degree, less 50  . Coronary artery disease Other     Social History   Tobacco Use  . Smoking status: Never Smoker  . Smokeless tobacco: Never Used  Substance Use Topics  . Alcohol use: No    Alcohol/week: 0.0 standard drinks  . Drug use: No    Home Medications Prior to Admission medications   Medication Sig Start Date End Date Taking? Authorizing Provider  acetaminophen (TYLENOL) 500 MG tablet Take 1 tablet (500 mg total) by mouth every 6 (six) hours as needed for moderate pain. 03/28/15   Regalado, Belkys A, MD  ALPRAZolam (XANAX) 0.5 MG tablet Take 1 tablet (0.5 mg total) by mouth at bedtime as needed for anxiety or sleep. 07/27/19   Nelwyn Salisbury, MD  buPROPion (WELLBUTRIN XL) 150 MG 24 hr tablet Take 1 tablet (150 mg total) by mouth daily. 07/27/19   Nelwyn Salisbury, MD  cholecalciferol (VITAMIN D) 1000 units tablet Take 5,000 Units by mouth daily.     [provider]  dexamethasone (DECADRON) 1 MG tablet Take at 9-10 pm, the night before blood test.  Patient not taking: Reported on 07/27/2019 06/17/17   Renato Shin, MD  doxycycline (VIBRAMYCIN) 100 MG capsule doxycycline hyclate 100 mg capsule    [provider]  estrogen, conjugated,-medroxyprogesterone (PREMPRO) 0.625-5 MG tablet Take by mouth. 04/18/19   [provider]  ferrous sulfate 325 (65 FE) MG tablet Take 1 tablet (325 mg total) by mouth 3 (three) times daily with meals. Patient not taking: Reported on 07/27/2019 03/28/15   Niel Hummer A, MD  ibuprofen (ADVIL) 600 MG tablet Take by mouth. 04/03/19   [provider]  levothyroxine (SYNTHROID) 200 MCG tablet TAKE 1 TABLET (200 MCG TOTAL) BY MOUTH DAILY BEFORE BREAKFAST. 07/05/19   Laurey Morale, MD   LORazepam (ATIVAN) 0.5 MG tablet Take 1 tablet (0.5 mg total) by mouth every 6 (six) hours as needed for anxiety. Patient not taking: Reported on 07/27/2019 04/29/18   Laurey Morale, MD    Allergies    Penicillins and Zoloft [sertraline]  Review of Systems   Review of Systems  Ten systems reviewed and are negative for acute change, except as noted in the HPI.    Physical Exam Updated Vital Signs BP 136/85 (BP Location: Right Arm)   Pulse 76   Temp 98.2 F (36.8 C) (Oral)   Resp 14   Ht 5' 0.6" (1.539 m)   Wt 110.7 kg   SpO2 98%   BMI 46.71 kg/m   Physical Exam Vitals and nursing note reviewed.  Constitutional:      General: She is not in acute distress.    Appearance: She is well-developed. She is not diaphoretic.     Comments: Obese female, in NAD  HENT:     Head: Normocephalic and atraumatic.     Right Ear: External ear normal.     Left Ear: External ear normal.     Mouth/Throat:     Mouth: Mucous membranes are moist.     Comments: Symmetric smile Eyes:     General: No scleral icterus.    Extraocular Movements: Extraocular movements intact.     Conjunctiva/sclera: Conjunctivae normal.     Pupils: Pupils are equal, round, and reactive to light.     Comments: Slight ptosis of the right upper eyelid compared to left with weakness of bilateral eyelids when opposing opening. EOMs intact. No nystagmus. PERRL. Eyebrow raise is intact and equal bilaterally.  Cardiovascular:     Rate and Rhythm: Normal rate and regular rhythm.     Pulses: Normal pulses.  Pulmonary:     Effort: Pulmonary effort is normal. No respiratory distress.     Comments: Respirations even and unlabored Musculoskeletal:        General: Normal range of motion.     Cervical back: Normal range of motion.  Skin:    General: Skin is warm and dry.     Coloration: Skin is not pale.     Findings: No erythema or rash.  Neurological:     Mental Status: She is alert and oriented to person, place, and  time.     Coordination: Coordination normal.     Comments: Moving all extremities spontaneously.  Psychiatric:        Behavior: Behavior normal.     ED Results / Procedures / Treatments   Labs (all labs ordered are listed, but only abnormal results are displayed) Labs Reviewed  TSH - Abnormal; Notable for the following components:      Result Value   TSH 0.012 (*)    All  other components within normal limits  ACETYLCHOLINE RECEPTOR AB, ALL  MUSK ANTIBODIES    EKG None  Radiology No results found.  Procedures Procedures (including critical care time)  Medications Ordered in ED Medications - No data to display  ED Course  I have reviewed the triage vital signs and the nursing notes.  Pertinent labs & imaging results that were available during my care of the patient were reviewed by me and considered in my medical decision making (see chart for details).    MDM Rules/Calculators/A&P                      44 year old female presents to the emergency department for evaluation of eyelid ptosis.  Symptoms began approximately 24 hours ago.  No other focal deficits appreciated on exam.  Case discussed with Dr. Laurence Slate of neurology.  Agrees with need for myasthenia work-up, but feels this can be completed outpatient.  Recommends adding inpatient labs for myasthenia gravis evaluation.  Will refer to [facility] neurology for follow-up.  Return precautions discussed and provided.  Patient discharged in stable condition with no unaddressed concerns.   Final Clinical Impression(s) / ED Diagnoses Final diagnoses:  Ptosis of right eyelid    Rx / DC Orders ED Discharge Orders         Ordered    Ambulatory referral to Neurology    Comments: An appointment is requested in approximately: 1 week; eyelid ptosis, assess for myasthenia gravis   08/07/19 0154           Antony Madura, PA-C 08/07/19 [id number]    Zadie Rhine, MD 08/07/19 [phone]

## 2019-08-07 NOTE — Discharge Instructions (Signed)
We recommend close follow-up with neurology for further evaluation of possible myasthenia gravis contributing to your symptoms.  You may continue your daily prescribed medicines.  Follow-up with your primary doctor in the interim.  Return to the ED for new or concerning symptoms such as severe headache, vision loss, fever, altered mental status, extremity numbness or weakness, shortness of breath.

## 2019-08-09 ENCOUNTER — Other Ambulatory Visit: Payer: Self-pay | Admitting: Family Medicine

## 2019-08-13 LAB — ACETYLCHOLINE RECEPTOR AB, ALL
Acety choline binding ab: <0.03 nmol/L (ref 0.00–0.24)
Acetylchol Block Ab: 17 % (ref 0–25)
Acetylcholine Modulat Ab: <12 % (ref 0–20)

## 2019-08-23 LAB — MUSK ANTIBODIES: MuSK Antibodies: <1 U/mL

## 2019-10-20 ENCOUNTER — Other Ambulatory Visit: Payer: Self-pay | Admitting: Family Medicine

## 2019-11-02 ENCOUNTER — Other Ambulatory Visit: Payer: Self-pay

## 2019-11-02 ENCOUNTER — Ambulatory Visit (INDEPENDENT_AMBULATORY_CARE_PROVIDER_SITE_OTHER): Payer: Self-pay | Admitting: Internal Medicine

## 2019-11-02 ENCOUNTER — Encounter (INDEPENDENT_AMBULATORY_CARE_PROVIDER_SITE_OTHER): Payer: Self-pay | Admitting: Internal Medicine

## 2019-11-02 VITALS — BP 132/80 | HR 77 | Temp 97.5°F | Resp 18 | Ht 66.0 in | Wt 248.8 lb

## 2019-11-02 DIAGNOSIS — E039 Hypothyroidism, unspecified: Secondary | ICD-10-CM

## 2019-11-02 DIAGNOSIS — E8941 Symptomatic postprocedural ovarian failure: Secondary | ICD-10-CM

## 2019-11-02 MED ORDER — PROGESTERONE 200 MG PO CAPS: 200.0000 mg | ORAL_CAPSULE | Freq: Every day | ORAL | 3 refills | Status: DC

## 2019-11-02 NOTE — Progress Notes (Signed)
Metrics: Intervention Frequency ACO  Documented Smoking Status Yearly  Screened one or more times in 24 months  Cessation Counseling or  Active cessation medication Past 24 months  Past 24 months   Guideline developer: UpToDate (See UpToDate for funding source) Date Released: 2014       Wellness Office Visit  Subjective:  Patient ID: Krystal Roberson, female    DOB: 1975-07-04  Age: 44 y.o. MRN: [id number]  CC: This delightful 44 year old lady 44 y.o. comes to our practice as a new patient. HPI She has a history of hypothyroidism and is on levothyroxine.  She had a total abdominal hysterectomy and by salpingo-oophorectomy in November 2020 for endometriosis.  There was a concern regarding ovarian cancer but this was negative.  Since that time, she was started on Premarin for hot flashes and symptoms of menopause but she did apparently not tolerate Premarin.  Now, she has insomnia and hot flashes.  She wants to feel better.  She is fatigued.  She wants to lose weight also.  Past Medical History:  Diagnosis Date  . Allergy   . Asthma   . Depression   . Headache(784.0)   . Migraine   . Thyroid disease   . UTI (lower urinary tract infection)    Past Surgical History:  Procedure Laterality Date  . ABDOMINAL HYSTERECTOMY  04/02/2019   TAH&BSO [facility]-Endometriosis  . CESAREAN SECTION    . CHOLECYSTECTOMY  03-2014   at [place]  2017   per Dr. Garwin Brothers      Family History  Problem Relation Age of Onset  . Arthritis Other   . Cancer Other        breast, 1st degree, less 50  . Diabetes Other        1st degree   . Hypertension Other   . Depression Other   . Stroke Other        1st degree, less 50  . Coronary artery disease Other   . Endometrial cancer Mother   . Diabetes Father   . Hypertension Father   . COPD Father     Social History   Social History Narrative   Widow since Nov 2019,married for 21 years.Lives with 2 daughters.    Social History   Tobacco Use  . Smoking status: Never Smoker  . Smokeless tobacco: Never Used  Substance Use Topics  . Alcohol use: No    Alcohol/week: 0.0 standard drinks    Current Meds  Medication Sig  . acetaminophen (TYLENOL) 500 MG tablet Take 1 tablet (500 mg total) by mouth every 6 (six) hours as needed for moderate pain.  . cholecalciferol (VITAMIN D) 1000 units tablet Take 5,000 Units by mouth daily.   Marland Kitchen levothyroxine (SYNTHROID) 200 MCG tablet TAKE 1 TABLET (200 MCG TOTAL) BY MOUTH DAILY BEFORE BREAKFAST.  . Multiple Vitamins-Minerals (WOMENS MULTIVITAMIN PO) Take 1 tablet by mouth daily.  . [DISCONTINUED] ALPRAZolam (XANAX) 0.5 MG tablet Take 1 tablet (0.5 mg total) by mouth at bedtime as needed for anxiety or sleep.  . [DISCONTINUED] doxycycline (VIBRAMYCIN) 100 MG capsule doxycycline hyclate 100 mg capsule  . [DISCONTINUED] ibuprofen (ADVIL) 600 MG tablet Take by mouth.       Depression screen [facility] 2/9 11/02/2019 07/27/2019 12/02/2013  Decreased Interest 2 2 1   Down, Depressed, Hopeless 2 2 2   PHQ - 2 Score 4 4 3   Altered sleeping 3 3 3   Tired, decreased energy 3 3 3   Change in  appetite 3 2 3   Feeling bad or failure about yourself  - 2 2  Trouble concentrating 0 2 1  Moving slowly or fidgety/restless 0 0 2  Suicidal thoughts 0 0 0  PHQ-9 Score 13 16 17   Difficult doing work/chores - Very difficult -     Objective:   Today's Vitals: BP 132/80 (BP Location: Right Arm, Patient Position: Sitting, Cuff Size: Normal)   Pulse 77   Temp (!) 97.5 F (36.4 C) (Temporal)   Resp 18   Ht 5\' 6"  (1.676 m)   Wt 248 lb 12.8 oz (112.9 kg)   SpO2 98%   BMI 40.16 kg/m  Vitals with BMI 11/02/2019 08/07/2019 08/06/2019  Height 5\' 6"  - 5' 0.6"  Weight 248 lbs 13 oz - 244 lbs  BMI 40.18 - 46.73  Systolic 132 136 11/04/2019  Diastolic 80 85 82  Pulse 77 76 88     Physical Exam   Her PHQ-9 score is 13.  She is morbidly obese.  Blood pressure is acceptable.  Alert and  orientated without any obvious focal neurological signs.    Assessment   1. Acquired hypothyroidism   2. Hot flashes due to surgical menopause   3. Morbid obesity (HCC)       Tests ordered Orders Placed This Encounter  Procedures  . Testos,Total,Free and SHBG (Female)  . Progesterone  . Estradiol     Plan: 1. Blood work is ordered. 2. After a full discussion, we are going to change the levothyroxine to NP thyroid.  I have given her samples of NP thyroid 90 mg daily and she will start taking this from tomorrow.  I explained the rationale for this change, explaining the importance of T3.  Her T3 level was 3.6 in March of this year.  We can optimize this further. 3. As far as her menopausal symptoms of hot flashes and night sweats, I will start her on progesterone 200 mg at night. 4. I will see her in about 3 weeks time to see how she is doing and we will have a full discussion regarding her morbid obesity to see if we can help her improve her overall health and reduce visceral fat.   Meds ordered this encounter  Medications  . progesterone (PROMETRIUM) 200 MG capsule    Sig: Take 1 capsule (200 mg total) by mouth daily.    Dispense:  30 capsule    Refill:  3    Robena Ewy 08/08/2019, MD

## 2019-11-05 LAB — TESTOS,TOTAL,FREE AND SHBG (FEMALE)
Free Testosterone: 4.9 pg/mL (ref 0.1–6.4)
Sex Hormone Binding: 22 nmol/L (ref 17–124)
Testosterone, Total, LC-MS-MS: 31 ng/dL (ref 2–45)

## 2019-11-05 LAB — ESTRADIOL: Estradiol: 18 pg/mL

## 2019-11-05 LAB — PROGESTERONE: Progesterone: 0.9 ng/mL

## 2019-11-21 ENCOUNTER — Other Ambulatory Visit: Payer: Self-pay | Admitting: Family Medicine

## 2019-11-23 ENCOUNTER — Ambulatory Visit (INDEPENDENT_AMBULATORY_CARE_PROVIDER_SITE_OTHER): Payer: Self-pay | Admitting: Internal Medicine

## 2019-11-23 ENCOUNTER — Encounter (INDEPENDENT_AMBULATORY_CARE_PROVIDER_SITE_OTHER): Payer: Self-pay | Admitting: Internal Medicine

## 2019-11-23 ENCOUNTER — Other Ambulatory Visit: Payer: Self-pay

## 2019-11-23 VITALS — BP 140/90 | HR 97 | Temp 97.7°F | Ht 66.0 in | Wt 251.8 lb

## 2019-11-23 DIAGNOSIS — E8941 Symptomatic postprocedural ovarian failure: Secondary | ICD-10-CM

## 2019-11-23 DIAGNOSIS — E039 Hypothyroidism, unspecified: Secondary | ICD-10-CM

## 2019-11-23 MED ORDER — ESTRADIOL 0.5 MG PO TABS: 0.5000 mg | ORAL_TABLET | Freq: Every day | ORAL | 3 refills | Status: DC

## 2019-11-23 MED ORDER — THYROID 120 MG PO TABS: 120.0000 mg | ORAL_TABLET | Freq: Every day | ORAL | 3 refills | Status: DC

## 2019-11-23 NOTE — Patient Instructions (Signed)
[facility] Dietary Recommendations for Weight Loss What to Avoid . Avoid added sugars o Often added sugar can be found in processed foods such as many condiments, dry cereals, cakes, cookies, chips, crisps, crackers, candies, sweetened drinks, etc.  o Read labels and AVOID/DECREASE use of foods with the following in their ingredient list: Sugar, fructose, high fructose corn syrup, sucrose, glucose, maltose, dextrose, molasses, cane sugar, brown sugar, any type of syrup, agave nectar, etc.   . Avoid snacking in between meals . Avoid foods made with flour o If you are going to eat food made with flour, choose those made with whole-grains; and, minimize your consumption as much as is tolerable . Avoid processed foods o These foods are generally stocked in the middle of the grocery store. Focus on shopping on the perimeter of the grocery.  . Avoid Meat  o We recommend following a plant-based diet at [facility]. Thus, we recommend avoiding meat as a general rule. Consider eating beans, legumes, eggs, and/or dairy products for regular protein sources o If you plan on eating meat limit to 4 ounces of meat at a time and choose lean options such as Fish, chicken, turkey. Avoid red meat intake such as pork and/or steak What to Include . Vegetables o GREEN LEAFY VEGETABLES: Kale, spinach, mustard greens, collard greens, cabbage, broccoli, etc. o OTHER: Asparagus, cauliflower, eggplant, carrots, peas, Brussel sprouts, tomatoes, bell peppers, zucchini, beets, cucumbers, etc. . Grains, seeds, and legumes o Beans: kidney beans, black eyed peas, garbanzo beans, black beans, pinto beans, etc. o Whole, unrefined grains: brown rice, barley, bulgur, oatmeal, etc. . Healthy fats  o Avoid highly processed fats such as vegetable oil o Examples of healthy fats: avocado, olives, virgin olive oil, dark chocolate (?72% Cocoa), nuts (peanuts, almonds, walnuts, cashews, pecans, etc.) . None to Low  Intake of Animal Sources of Protein o Meat sources: chicken, turkey, salmon, tuna. Limit to 4 ounces of meat at one time. o Consider limiting dairy sources, but when choosing dairy focus on: PLAIN Greek yogurt, cottage cheese, high-protein milk . Fruit o Choose berries  When to Eat . Intermittent Fasting: o Choosing not to eat for a specific time period, but DO FOCUS ON HYDRATION when fasting o Multiple Techniques: - Time Restricted Eating: eat 3 meals in a day, each meal lasting no more than 60 minutes, no snacks between meals - 16-18 hour fast: fast for 16 to 18 hours up to 7 days a week. Often suggested to start with 2-3 nonconsecutive days per week.  . Remember the time you sleep is counted as fasting.  . Examples of eating schedule: Fast from 7:00pm-11:00am. Eat between 11:00am-7:00pm.  - 24-hour fast: fast for 24 hours up to every other day. Often suggested to start with 1 day per week . Remember the time you sleep is counted as fasting . Examples of eating schedule:  o Eating day: eat 2-3 meals on your eating day. If doing 2 meals, each meal should last no more than 90 minutes. If doing 3 meals, each meal should last no more than 60 minutes. Finish last meal by 7:00pm. o Fasting day: Fast until 7:00pm.  o IF YOU FEEL UNWELL FOR ANY REASON/IN ANY WAY WHEN FASTING, STOP FASTING BY EATING A NUTRITIOUS SNACK OR LIGHT MEAL o ALWAYS FOCUS ON HYDRATION DURING FASTS - Acceptable Hydration sources: water, broths, tea/coffee (black tea/coffee is best but using a small amount of whole-fat dairy products in coffee/tea is acceptable).  -   Poor Hydration Sources: anything with sugar or artificial sweeteners added to it  These recommendations have been developed for patients that are actively receiving medical care from either Dr. Aron Inge or Sarah Gray, DNP, NP-C at [facility]. These recommendations are developed for patients with specific medical conditions and are not meant to be  distributed or used by others that are not actively receiving care from either provider listed above at [facility]. It is not appropriate to participate in the above eating plans without proper medical supervision.   Reference: Fung, J. The obesity code. [place]: [place]; 2016.   

## 2019-11-23 NOTE — Progress Notes (Signed)
Metrics: Intervention Frequency ACO  Documented Smoking Status Yearly  Screened one or more times in 24 months  Cessation Counseling or  Active cessation medication Past 24 months  Past 24 months   Guideline developer: UpToDate (See UpToDate for funding source) Date Released: 2014       Wellness Office Visit  Subjective:  Patient ID: Krystal Roberson, female    DOB: 01/28/1976  Age: 44 y.o. MRN: [id number]  CC: This lady comes in for follow-up of hypothyroidism, morbid obesity, menopausal symptoms. HPI  On the last visit, I started her on progesterone at night and she is sleeping much better and her hot flashes and night sweats have also improved somewhat. She has tolerated the starting dose of NP thyroid of 90 mg daily and she has not had any side effects.  She feels that she has a little better energy than she used to. She wishes to lose weight. Past Medical History:  Diagnosis Date  . Allergy   . Asthma   . Depression   . Headache(784.0)   . Migraine   . Thyroid disease   . UTI (lower urinary tract infection)    Past Surgical History:  Procedure Laterality Date  . ABDOMINAL HYSTERECTOMY  04/02/2019   TAH&BSO [facility]-Endometriosis  . CESAREAN SECTION    . CHOLECYST[facility]OMY  03-2014   at [facility]   . ENDOMETRIAL ABLATION W/ NOVASURE  2017   per Dr. Cherly Hensen      Family History  Problem Relation Age of Onset  . Arthritis Other   . Cancer Other        breast, 1st degree, less 50  . Diabetes Other        1st degree   . Hypertension Other   . Depression Other   . Stroke Other        1st degree, less 50  . Coronary artery disease Other   . Endometrial cancer Mother   . Diabetes Father   . Hypertension Father   . COPD Father     Social History   Social History Narrative   Widow since Nov 2019,married for 21 years.Lives with 2 daughters.   Social History   Tobacco Use  . Smoking status: Never Smoker  . Smokeless tobacco: Never Used  Substance Use  Topics  . Alcohol use: No    Alcohol/week: 0.0 standard drinks    Current Meds  Medication Sig  . acetaminophen (TYLENOL) 500 MG tablet Take 1 tablet (500 mg total) by mouth every 6 (six) hours as needed for moderate pain.  . cholecalciferol (VITAMIN D) 1000 units tablet Take 5,000 Units by mouth daily.   . Multiple Vitamins-Minerals (WOMENS MULTIVITAMIN PO) Take 1 tablet by mouth daily.  . progesterone (PROMETRIUM) 200 MG capsule Take 1 capsule (200 mg total) by mouth daily.  Marland Kitchen thyroid (NP THYROID) 90 MG tablet Take 90 mg by mouth daily.  . [DISCONTINUED] thyroid (ARMOUR) 90 MG tablet Take 90 mg by mouth daily.      Depression screen [facility] 2/9 11/02/2019 07/27/2019 12/02/2013  Decreased Interest 2 2 1   Down, Depressed, Hopeless 2 2 2   PHQ - 2 Score 4 4 3   Altered sleeping 3 3 3   Tired, decreased energy 3 3 3   Change in appetite 3 2 3   Feeling bad or failure about yourself  - 2 2  Trouble concentrating 0 2 1  Moving slowly or fidgety/restless 0 0 2  Suicidal thoughts 0 0 0  PHQ-9 Score 13 16  17  Difficult doing work/chores - Very difficult -     Objective:   Today's Vitals: BP 140/90 (BP Location: Left Arm, Patient Position: Sitting, Cuff Size: Normal)   Pulse 97   Temp 97.7 F (36.5 C) (Temporal)   Ht 5\' 6"  (1.676 m)   Wt 251 lb 12.8 oz (114.2 kg)   SpO2 97%   BMI 40.64 kg/m  Vitals with BMI 11/23/2019 11/02/2019 08/07/2019  Height 5\' 6"  5\' 6"  -  Weight 251 lbs 13 oz 248 lbs 13 oz -  BMI 40.66 40.18 -  Systolic 140 132 08/09/2019  Diastolic 90 80 85  Pulse 97 77 76     Physical Exam   She looks systemically well.  She remains morbidly obese.  Blood pressure borderline elevated.    Assessment   1. Hot flashes due to surgical menopause   2. Acquired hypothyroidism   3. Morbid obesity (HCC)       Tests ordered No orders of the defined types were placed in this encounter.    Plan: 1. As far as her menopausal symptoms are concerned, she will continue with  progesterone at night and I will add estradiol now 0.5 mg daily.  We have discussed bioidentical hormones previously. 2. As far as her hypothyroid is concerned, I am going to increase the dose of NP thyroid to 120 mg daily and I have sent a new prescription and she will start taking this soon. 3. As far as her morbid obesity is concerned, I spent most of the visit discussing nutrition and the concept of intermittent fasting, maintaining hydration, combined with more of a plant-based diet.  Answered all the questions regarding this. 4. Follow-up in 6 weeks when we will do all the blood work.  Today I spent approximately 45 minutes with the patient explaining all of the above.   Meds ordered this encounter  Medications  . thyroid (NP THYROID) 120 MG tablet    Sig: Take 1 tablet (120 mg total) by mouth daily before breakfast.    Dispense:  30 tablet    Refill:  3  . estradiol (ESTRACE) 0.5 MG tablet    Sig: Take 1 tablet (0.5 mg total) by mouth daily.    Dispense:  30 tablet    Refill:  3    Ahtziry Saathoff , MD

## 2019-11-26 DIAGNOSIS — N393 Stress incontinence (female) (male): Secondary | ICD-10-CM | POA: Insufficient documentation

## 2019-11-26 DIAGNOSIS — N8111 Cystocele, midline: Secondary | ICD-10-CM | POA: Insufficient documentation

## 2019-12-06 ENCOUNTER — Encounter (INDEPENDENT_AMBULATORY_CARE_PROVIDER_SITE_OTHER): Payer: Self-pay | Admitting: Internal Medicine

## 2019-12-06 ENCOUNTER — Other Ambulatory Visit (INDEPENDENT_AMBULATORY_CARE_PROVIDER_SITE_OTHER): Payer: Self-pay | Admitting: Internal Medicine

## 2019-12-06 MED ORDER — THYROID 60 MG PO TABS: 120.0000 mg | ORAL_TABLET | Freq: Every day | ORAL | 3 refills | Status: DC

## 2019-12-06 NOTE — Telephone Encounter (Signed)
Please advise 

## 2019-12-29 HISTORY — PX: VAGINAL PROLAPSE REPAIR: SHX830

## 2020-01-04 ENCOUNTER — Ambulatory Visit (INDEPENDENT_AMBULATORY_CARE_PROVIDER_SITE_OTHER): Payer: Self-pay | Admitting: Internal Medicine

## 2020-01-04 ENCOUNTER — Other Ambulatory Visit: Payer: Self-pay

## 2020-01-04 ENCOUNTER — Encounter (INDEPENDENT_AMBULATORY_CARE_PROVIDER_SITE_OTHER): Payer: Self-pay | Admitting: Internal Medicine

## 2020-01-04 VITALS — BP 124/80 | HR 83 | Temp 97.5°F | Ht 66.0 in | Wt 258.0 lb

## 2020-01-04 DIAGNOSIS — E8941 Symptomatic postprocedural ovarian failure: Secondary | ICD-10-CM

## 2020-01-04 DIAGNOSIS — E039 Hypothyroidism, unspecified: Secondary | ICD-10-CM

## 2020-01-04 LAB — TSH: TSH: 0.38 mIU/L — ABNORMAL LOW

## 2020-01-04 LAB — PROGESTERONE: Progesterone: 6.5 ng/mL

## 2020-01-04 LAB — ESTRADIOL: Estradiol: <15 pg/mL

## 2020-01-04 LAB — T3, FREE: T3, Free: 3.3 pg/mL (ref 2.3–4.2)

## 2020-01-04 NOTE — Progress Notes (Signed)
Metrics: Intervention Frequency ACO  Documented Smoking Status Yearly  Screened one or more times in 24 months  Cessation Counseling or  Active cessation medication Past 24 months  Past 24 months   Guideline developer: UpToDate (See UpToDate for funding source) Date Released: 2014       Wellness Office Visit  Subjective:  Patient ID: Krystal Roberson, female    DOB: 21-Aug-1975  Age: 44 y.o. MRN: [id number]  CC: This lady comes in for follow-up of hypothyroidism, morbid obesity, menopausal symptoms. HPI  She recently had surgery for vagina prolapse and she had repair of this.  As a result, her nutrition has not been as consistent and she has gained some weight. She is tolerating estradiol and progesterone and her menopausal symptoms are much improved. She also tolerated the higher dose of NP thyroid without any problems. Past Medical History:  Diagnosis Date  . Allergy   . Asthma   . Depression   . Headache(784.0)   . Migraine   . Thyroid disease   . UTI (lower urinary tract infection)    Past Surgical History:  Procedure Laterality Date  . ABDOMINAL HYSTERECTOMY  04/02/2019   TAH&BSO [facility]-Endometriosis  . CESAREAN SECTION    . CHOLECYSTECTOMY  03-2014   at [facility]   . ENDOMETRIAL ABLATION W/ NOVASURE  2017   per Dr. Cherly Hensen      Family History  Problem Relation Age of Onset  . Arthritis Other   . Cancer Other        breast, 1st degree, less 50  . Diabetes Other        1st degree   . Hypertension Other   . Depression Other   . Stroke Other        1st degree, less 50  . Coronary artery disease Other   . Endometrial cancer Mother   . Diabetes Father   . Hypertension Father   . COPD Father     Social History   Social History Narrative   Widow since Nov 2019,married for 21 years.Lives with 2 daughters.   Social History   Tobacco Use  . Smoking status: Never Smoker  . Smokeless tobacco: Never Used  Substance Use Topics  . Alcohol use: No     Alcohol/week: 0.0 standard drinks    Current Meds  Medication Sig  . acetaminophen (TYLENOL) 500 MG tablet Take 1 tablet (500 mg total) by mouth every 6 (six) hours as needed for moderate pain.  . cholecalciferol (VITAMIN D) 1000 units tablet Take 5,000 Units by mouth daily.   Marland Kitchen estradiol (ESTRACE) 0.5 MG tablet Take 1 tablet (0.5 mg total) by mouth daily.  . progesterone (PROMETRIUM) 200 MG capsule Take 1 capsule (200 mg total) by mouth daily.  Marland Kitchen thyroid (NP THYROID) 60 MG tablet Take 2 tablets (120 mg total) by mouth daily before breakfast.       Depression screen [facility] 2/9 11/02/2019 07/27/2019 12/02/2013  Decreased Interest 2 2 1   Down, Depressed, Hopeless 2 2 2   PHQ - 2 Score 4 4 3   Altered sleeping 3 3 3   Tired, decreased energy 3 3 3   Change in appetite 3 2 3   Feeling bad or failure about yourself  - 2 2  Trouble concentrating 0 2 1  Moving slowly or fidgety/restless 0 0 2  Suicidal thoughts 0 0 0  PHQ-9 Score 13 16 17   Difficult doing work/chores - Very difficult -     Objective:   Today's Vitals:  BP 124/80 (BP Location: Left Arm, Patient Position: Sitting, Cuff Size: Normal)   Pulse 83   Temp (!) 97.5 F (36.4 C) (Temporal)   Ht 5\' 6"  (1.676 m)   Wt 258 lb (117 kg)   SpO2 98%   BMI 41.64 kg/m  Vitals with BMI 01/04/2020 11/23/2019 11/02/2019  Height 5\' 6"  5\' 6"  5\' 6"   Weight 258 lbs 251 lbs 13 oz 248 lbs 13 oz  BMI 41.66 40.66 40.18  Systolic 124 140 11/04/2019  Diastolic 80 90 80  Pulse 83 97 77     Physical Exam  She looks systemically well.  She remains morbidly obese and has gained 7 pounds.  Blood pressure is well controlled.     Assessment   1. Acquired hypothyroidism   2. Hot flashes due to surgical menopause   3. Morbid obesity (HCC)       Tests ordered Orders Placed This Encounter  Procedures  . Estradiol  . Progesterone  . T3, free  . TSH     Plan: 1. She will continue with the same dose of NP thyroid and I will check thyroid  function today. 2. She will continue with estradiol and progesterone at current doses and we will check levels today. 3. We discussed nutrition again and I encouraged her to replace chicken that she eats almost on a daily basis with beans on at least half of the days.  She will work towards this.  I have also recommended to replace chicken with fish. 4. Further recommendations will depend on blood results and I will see her in about 2 months for follow-up.   No orders of the defined types were placed in this encounter.   , MD

## 2020-01-05 ENCOUNTER — Other Ambulatory Visit (INDEPENDENT_AMBULATORY_CARE_PROVIDER_SITE_OTHER): Payer: Self-pay | Admitting: Internal Medicine

## 2020-01-05 MED ORDER — ESTRADIOL 1 MG PO TABS: 1.0000 mg | ORAL_TABLET | Freq: Every day | ORAL | 3 refills | Status: DC

## 2020-03-06 ENCOUNTER — Ambulatory Visit (INDEPENDENT_AMBULATORY_CARE_PROVIDER_SITE_OTHER): Payer: Self-pay | Admitting: Internal Medicine

## 2020-05-22 ENCOUNTER — Other Ambulatory Visit (INDEPENDENT_AMBULATORY_CARE_PROVIDER_SITE_OTHER): Payer: Self-pay | Admitting: Internal Medicine

## 2020-05-24 ENCOUNTER — Other Ambulatory Visit (INDEPENDENT_AMBULATORY_CARE_PROVIDER_SITE_OTHER): Payer: Self-pay | Admitting: Internal Medicine

## 2020-05-24 ENCOUNTER — Telehealth (INDEPENDENT_AMBULATORY_CARE_PROVIDER_SITE_OTHER): Payer: Self-pay | Admitting: Internal Medicine

## 2020-05-24 MED ORDER — THYROID 120 MG PO TABS: 120.0000 mg | ORAL_TABLET | Freq: Every day | ORAL | 0 refills | Status: DC

## 2020-05-24 NOTE — Telephone Encounter (Signed)
Done

## 2020-05-26 ENCOUNTER — Encounter (INDEPENDENT_AMBULATORY_CARE_PROVIDER_SITE_OTHER): Payer: Self-pay | Admitting: Internal Medicine

## 2020-05-27 MED ORDER — THYROID 120 MG PO TABS: 120.0000 mg | ORAL_TABLET | Freq: Every day | ORAL | 0 refills | Status: DC

## 2020-06-27 ENCOUNTER — Encounter (INDEPENDENT_AMBULATORY_CARE_PROVIDER_SITE_OTHER): Payer: Self-pay | Admitting: Internal Medicine

## 2020-06-27 ENCOUNTER — Other Ambulatory Visit: Payer: Self-pay

## 2020-06-27 ENCOUNTER — Ambulatory Visit (INDEPENDENT_AMBULATORY_CARE_PROVIDER_SITE_OTHER): Payer: Self-pay | Admitting: Internal Medicine

## 2020-06-27 VITALS — BP 140/100 | HR 80 | Ht 66.0 in | Wt 270.0 lb

## 2020-06-27 DIAGNOSIS — E039 Hypothyroidism, unspecified: Secondary | ICD-10-CM

## 2020-06-27 NOTE — Progress Notes (Signed)
Metrics: Intervention Frequency ACO  Documented Smoking Status Yearly  Screened one or more times in 24 months  Cessation Counseling or  Active cessation medication Past 24 months  Past 24 months   Guideline developer: UpToDate (See UpToDate for funding source) Date Released: 2014       Wellness Office Visit  Subjective:  Patient ID: Krystal Roberson, female    DOB: 03/14/1976  Age: 45 y.o. MRN: [id number]  CC: This lady comes in for follow-up of hypothyroidism, morbid obesity. HPI  She is also postmenopausal having had a hysterectomy and salpingo-oophorectomy. Unfortunately, she did not tolerate progesterone as it seemed to make her feel that she was gaining weight. She has also not been taking estradiol on a regular basis. She has been taking NP thyroid which she says she feels much better on compared to the Synthroid. Unfortunately, she has not done so well with nutrition and has gained weight. Past Medical History:  Diagnosis Date  . Allergy   . Asthma   . Depression   . Headache(784.0)   . Migraine   . Thyroid disease   . UTI (lower urinary tract infection)    Past Surgical History:  Procedure Laterality Date  . ABDOMINAL HYSTERECTOMY  04/02/2019   [facility]H&BSO [facility]Endometriosis  . CESAREAN SECTION    . CHOLECYSTECTOMY  03-2014   at [facility]   . ENDOMETRIAL ABLATION W/ NOVASURE  2017   per Dr. Cherly Hensen   . VAGINAL PROLAPSE REPAIR  12/29/2019     Family History  Problem Relation Age of Onset  . Arthritis Other   . Cancer Other        breast, 1st degree, less 50  . Diabetes Other        1st degree   . Hypertension Other   . Depression Other   . Stroke Other        1st degree, less 50  . Coronary artery disease Other   . Endometrial cancer Mother   . Diabetes Father   . Hypertension Father   . COPD Father     Social History   Social History Narrative   Widow since Nov 2019,married for 21 years.Lives with 2 daughters.   Social History    Tobacco Use  . Smoking status: Never Smoker  . Smokeless tobacco: Never Used  Substance Use Topics  . Alcohol use: No    Alcohol/week: 0.0 standard drinks    Current Meds  Medication Sig  . Cholecalciferol (VITAMIN D) 125 MCG (5000 UT) CAPS Take 5,000 Units by mouth daily.   Marland Kitchen thyroid (NP THYROID) 120 MG tablet Take 1 tablet (120 mg total) by mouth daily before breakfast.     Flowsheet Row Office Visit from 11/02/2019 in [facility]  PHQ-9 Total Score 13      Objective:   Today's Vitals: BP (!) 140/100   Pulse 80   Ht 5\' 6"  (1.676 m)   Wt 270 lb (122.5 kg)   BMI 43.58 kg/m  Vitals with BMI 06/27/2020 01/04/2020 11/23/2019  Height 5\' 6"  5\' 6"  5\' 6"   Weight 270 lbs 258 lbs 251 lbs 13 oz  BMI 43.6 41.66 40.66  Systolic 140 124 11/25/2019  Diastolic 100 80 90  Pulse 80 83 97     Physical Exam   She remains morbidly obese and has gained 12 pounds since last time I saw her. Also her blood pressure is elevated now.    Assessment   1. Acquired hypothyroidism   2.  Morbid obesity (HCC)       Tests ordered Orders Placed This Encounter  Procedures  . T3, free  . Hemoglobin A1c     Plan: 1. She will continue with NP thyroid 120 mg daily and we still may need to further optimize this. I will check a free T3 level today. 2. I will check her for diabetes and she will continue to work with nutrition as before. We discussed the possibility of using Metformin which is affordable for her since she does not have health insurance. 3. Follow-up in 3 months   No orders of the defined types were placed in this encounter.   Wilson Singer, MD

## 2020-06-28 LAB — HEMOGLOBIN A1C
Hgb A1c MFr Bld: 5.8 % of total Hgb — ABNORMAL HIGH (ref <5.7)
Mean Plasma Glucose: 120 mg/dL
eAG (mmol/L): 6.6 mmol/L

## 2020-06-28 LAB — T3, FREE: T3, Free: 4.6 pg/mL — ABNORMAL HIGH (ref 2.3–4.2)

## 2020-07-22 ENCOUNTER — Other Ambulatory Visit (INDEPENDENT_AMBULATORY_CARE_PROVIDER_SITE_OTHER): Payer: Self-pay | Admitting: Internal Medicine

## 2020-07-22 ENCOUNTER — Encounter (INDEPENDENT_AMBULATORY_CARE_PROVIDER_SITE_OTHER): Payer: Self-pay | Admitting: Internal Medicine

## 2020-09-26 ENCOUNTER — Ambulatory Visit (INDEPENDENT_AMBULATORY_CARE_PROVIDER_SITE_OTHER): Payer: Self-pay | Admitting: Internal Medicine

## 2020-10-18 ENCOUNTER — Ambulatory Visit (INDEPENDENT_AMBULATORY_CARE_PROVIDER_SITE_OTHER): Payer: Self-pay | Admitting: Internal Medicine

## 2020-10-18 ENCOUNTER — Other Ambulatory Visit: Payer: Self-pay

## 2020-10-18 ENCOUNTER — Encounter (INDEPENDENT_AMBULATORY_CARE_PROVIDER_SITE_OTHER): Payer: Self-pay | Admitting: Internal Medicine

## 2020-10-18 DIAGNOSIS — R062 Wheezing: Secondary | ICD-10-CM

## 2020-10-18 DIAGNOSIS — E8941 Symptomatic postprocedural ovarian failure: Secondary | ICD-10-CM

## 2020-10-18 DIAGNOSIS — E039 Hypothyroidism, unspecified: Secondary | ICD-10-CM

## 2020-10-18 DIAGNOSIS — R7303 Prediabetes: Secondary | ICD-10-CM

## 2020-10-18 MED ORDER — METFORMIN HCL ER 500 MG PO TB24: 500.0000 mg | ORAL_TABLET | Freq: Two times a day (BID) | ORAL | 3 refills | Status: DC

## 2020-10-18 MED ORDER — ESTRADIOL 1 MG PO TABS: 1.0000 mg | ORAL_TABLET | Freq: Every day | ORAL | 3 refills | Status: DC

## 2020-10-18 MED ORDER — MONTELUKAST SODIUM 10 MG PO TABS: 10.0000 mg | ORAL_TABLET | Freq: Every day | ORAL | 3 refills | Status: DC

## 2020-10-18 NOTE — Progress Notes (Signed)
Pt said she is going about to had a hysterectomy in 2020. She feels food daily has not change; not eating much but same. Pt also feels she has a wheeze or heavy in chest concerns?

## 2020-10-18 NOTE — Progress Notes (Signed)
Metrics: Intervention Frequency ACO  Documented Smoking Status Yearly  Screened one or more times in 24 months  Cessation Counseling or  Active cessation medication Past 24 months  Past 24 months   Guideline developer: UpToDate (See UpToDate for funding source) Date Released: 2014       Wellness Office Visit  Subjective:  Patient ID: Krystal Roberson, female    DOB: 02-Apr-1976  Age: 45 y.o. MRN: [id number]  CC: This lady comes in for follow-up of morbid obesity, prediabetes, hypothyroidism and menopausal symptoms. HPI  Unfortunately, she continues to gain weight.  She has not been able to successfully be consistent with intermittent fasting as we had discussed previously. She also discontinued estradiol and progesterone because she thought the progesterone was making her gain weight.  Since stopping all these hormones, she still continues to gain weight. She has tolerated NP thyroid 120 mg daily for her hypothyroidism. Past Medical History:  Diagnosis Date  . Allergy   . Asthma   . Depression   . Headache(784.0)   . Migraine   . Thyroid disease   . UTI (lower urinary tract infection)    Past Surgical History:  Procedure Laterality Date  . ABDOMINAL HYSTERECTOMY  04/02/2019   [facility]H&BSO [facility]Endometriosis  . CESAREAN SECTION    . CHOLECYSTECTOMY  03-2014   at [facility]   . ENDOMETRIAL ABLATION W/ NOVASURE  2017   per Dr. Cherly Hensen   . VAGINAL PROLAPSE REPAIR  12/29/2019     Family History  Problem Relation Age of Onset  . Arthritis Other   . Cancer Other        breast, 1st degree, less 50  . Diabetes Other        1st degree   . Hypertension Other   . Depression Other   . Stroke Other        1st degree, less 50  . Coronary artery disease Other   . Endometrial cancer Mother   . Diabetes Father   . Hypertension Father   . COPD Father     Social History   Social History Narrative   Widow since Nov 2019,married for 21 years.Lives with 2 daughters.    Social History   Tobacco Use  . Smoking status: Never Smoker  . Smokeless tobacco: Never Used  Substance Use Topics  . Alcohol use: No    Alcohol/week: 0.0 standard drinks    Current Meds  Medication Sig  . Cholecalciferol (VITAMIN D) 125 MCG (5000 UT) CAPS Take 5,000 Units by mouth daily.   Marland Kitchen estradiol (ESTRACE) 1 MG tablet Take 1 tablet (1 mg total) by mouth daily.  . metFORMIN (GLUCOPHAGE-XR) 500 MG 24 hr tablet Take 1 tablet (500 mg total) by mouth 2 (two) times daily.  . montelukast (SINGULAIR) 10 MG tablet Take 1 tablet (10 mg total) by mouth at bedtime.  . NP THYROID 120 MG tablet TAKE 1 TABLET BY MOUTH ONCE DAILY BEFORE BREAKFAST     Flowsheet Row Office Visit from 10/18/2020 in [facility]  PHQ-9 Total Score 4      Objective:   Today's Vitals: BP 130/71 (BP Location: Right Arm, Patient Position: Sitting, Cuff Size: Normal)   Pulse 87   Temp (!) 97.1 F (36.2 C) (Temporal)   Resp 18   Ht 5\' 6"  (1.676 m)   Wt 274 lb 3.2 oz (124.4 kg)   SpO2 96%   BMI 44.26 kg/m  Vitals with BMI 10/18/2020 06/27/2020 01/04/2020  Height 5\' 6"   5\' 6"  5\' 6"   Weight 274 lbs 3 oz 270 lbs 258 lbs  BMI 44.28 43.6 41.66  Systolic 130 140  Diastolic 71 100 80  Pulse 87 80 83     Physical Exam  She remains morbidly obese and has gained 4 pounds since the last time she was seen in February.  Blood pressure is better.     Assessment   1. Morbid obesity (HCC)   2. Acquired hypothyroidism   3. Prediabetes   4. Hot flashes due to surgical menopause   5. Wheezing       Tests ordered No orders of the defined types were placed in this encounter.    Plan: 1. We discussed her prediabetes and morbid obesity.  I am going to start her on metformin.  We also discussed nutrition in more detail especially intermittent fasting.  I discussed the importance of hydration and salt intake for prolonged periods of fasting. 2. We will also optimize her thyroid which will help  her insulin resistance.  I have given her samples of NP thyroid 90 mg tablets which she will take twice a day and then work up to eventually NP thyroid 120 mg twice a day. 3. She is agreeable to go back on estradiol for now and I have sent a new prescription of estradiol 1 mg daily.  Eventually, I want to her to start back on progesterone also. 4. I will see her in a couple of months time for follow-up.   Meds ordered this encounter  Medications  . metFORMIN (GLUCOPHAGE-XR) 500 MG 24 hr tablet    Sig: Take 1 tablet (500 mg total) by mouth 2 (two) times daily.    Dispense:  60 tablet    Refill:  3  . estradiol (ESTRACE) 1 MG tablet    Sig: Take 1 tablet (1 mg total) by mouth daily.    Dispense:  30 tablet    Refill:  3  . montelukast (SINGULAIR) 10 MG tablet    Sig: Take 1 tablet (10 mg total) by mouth at bedtime.    Dispense:  30 tablet    Refill:  3    Gilbert Narain 269, MD

## 2020-12-17 ENCOUNTER — Other Ambulatory Visit (INDEPENDENT_AMBULATORY_CARE_PROVIDER_SITE_OTHER): Payer: Self-pay | Admitting: Internal Medicine

## 2020-12-17 DIAGNOSIS — E039 Hypothyroidism, unspecified: Secondary | ICD-10-CM

## 2020-12-20 NOTE — Telephone Encounter (Signed)
Please call this patient and let her know that I did a chart review after receiving prescription request for her NP thyroid.  Based on her last blood work I think she should reduce her dose from 120 mg by mouth daily to 90 mg by mouth daily.  She also needs a follow-up regarding blood work to make sure that this reduced dose is appropriate for her.  Thus, have also ordered referral to endocrinology.  She can certainly forego referral to endocrinology if she has a primary care provider that will be comfortable treating her hypothyroidism.  Thus, if she feels she does not need the referral to endocrinology can cancel this but make sure that she is aware she should get blood work to check her thyroid levels as soon as possible when she establishes with a new primary care provider.  Please let me know if she has any questions.  Thank you.

## 2020-12-25 ENCOUNTER — Ambulatory Visit (INDEPENDENT_AMBULATORY_CARE_PROVIDER_SITE_OTHER): Payer: Self-pay | Admitting: Internal Medicine

## 2020-12-25 ENCOUNTER — Ambulatory Visit: Payer: Self-pay | Admitting: Nurse Practitioner

## 2020-12-27 ENCOUNTER — Other Ambulatory Visit (INDEPENDENT_AMBULATORY_CARE_PROVIDER_SITE_OTHER): Payer: Self-pay

## 2020-12-27 DIAGNOSIS — E039 Hypothyroidism, unspecified: Secondary | ICD-10-CM

## 2020-12-28 NOTE — Telephone Encounter (Signed)
I reduced her dose to 90 mg by mouth daily based on her last blood work.  I thought that I had reached out to the patient and notified her of this, I do not see documentation in the chart that she was told.  If you have time please give her a call make sure she is aware.  Thank you.

## 2021-01-17 ENCOUNTER — Encounter: Payer: Self-pay | Admitting: Nurse Practitioner

## 2021-01-17 ENCOUNTER — Other Ambulatory Visit: Payer: Self-pay

## 2021-01-17 ENCOUNTER — Ambulatory Visit (INDEPENDENT_AMBULATORY_CARE_PROVIDER_SITE_OTHER): Payer: Self-pay | Admitting: Nurse Practitioner

## 2021-01-17 VITALS — BP 128/80 | HR 80 | Ht 66.0 in | Wt 274.4 lb

## 2021-01-17 DIAGNOSIS — E039 Hypothyroidism, unspecified: Secondary | ICD-10-CM

## 2021-01-17 MED ORDER — LEVOTHYROXINE SODIUM 100 MCG PO TABS: 100.0000 ug | ORAL_TABLET | Freq: Every day | ORAL | 3 refills | Status: DC

## 2021-01-17 MED ORDER — LEVOTHYROXINE SODIUM 88 MCG PO TABS: 88.0000 ug | ORAL_TABLET | Freq: Every day | ORAL | 3 refills | Status: DC

## 2021-01-17 NOTE — Patient Instructions (Signed)

## 2021-01-17 NOTE — Progress Notes (Signed)
Endocrinology Consult Note                                         01/17/2021, 11:33 AM  Subjective:   Subjective    Krystal Roberson is a 45 y.o.-year-old female patient being seen in consultation for hypothyroidism referred by Pcp, No.   Past Medical History:  Diagnosis Date   Allergy    Asthma    Depression    Headache(784.0)    Migraine    Thyroid disease    UTI (lower urinary tract infection)     Past Surgical History:  Procedure Laterality Date   ABDOMINAL HYSTERECTOMY  04/02/2019   TAH&BSO [facility]-Endometriosis   CESAREAN SECTION     CHOLECYSTECTOMY  03-2014   at [facility]    ENDOMETRIAL ABLATION W/ NOVASURE  2017   per Dr. Cherly Hensen    VAGINAL PROLAPSE REPAIR  12/29/2019    Social History   Socioeconomic History   Marital status: Married    Spouse name: Not on file   Number of children: Not on file   Years of education: Not on file   Highest education level: Not on file  Occupational History   Not on file  Tobacco Use   Smoking status: Never   Smokeless tobacco: Never  Vaping Use   Vaping Use: Never used  Substance and Sexual Activity   Alcohol use: No    Alcohol/week: 0.0 standard drinks   Drug use: No   Sexual activity: Not on file  Other Topics Concern   Not on file  Social History Narrative   Widow since Nov 2019,married for 21 years.Lives with 2 daughters.   Social Determinants of Health   Financial Resource Strain: Not on file  Food Insecurity: Not on file  Transportation Needs: Not on file  Physical Activity: Not on file  Stress: Not on file  Social Connections: Not on file    Family History  Problem Relation Age of Onset   Thyroid disease Mother    Endometrial cancer Mother    Diabetes Father    Hypertension Father    COPD Father    Arthritis Other    Cancer Other        breast, 1st degree, less 50   Diabetes Other        1st degree    Hypertension  Other    Depression Other    Stroke Other        1st degree, less 50   Coronary artery disease Other     Outpatient Encounter Medications as of 01/17/2021  Medication Sig   Cholecalciferol (VITAMIN D) 125 MCG (5000 UT) CAPS Take 5,000 Units by mouth daily.    levothyroxine (SYNTHROID) 100 MCG tablet Take 1 tablet (100 mcg total) by mouth daily. Take in addition to 88 mcg tab po daily before breakfast   levothyroxine (SYNTHROID) 88 MCG tablet Take 1 tablet (88 mcg total) by mouth daily before breakfast. Take in addition to 100 mcg po daily  before breakfast   [DISCONTINUED] thyroid (NP THYROID) 90 MG tablet Take 1 tablet (90 mg total) by mouth daily before breakfast.   estradiol (ESTRACE) 1 MG tablet Take 1 tablet (1 mg total) by mouth daily. (Patient not taking: Reported on 01/17/2021)   metFORMIN (GLUCOPHAGE-XR) 500 MG 24 hr tablet Take 1 tablet (500 mg total) by mouth 2 (two) times daily. (Patient not taking: Reported on 01/17/2021)   montelukast (SINGULAIR) 10 MG tablet Take 1 tablet (10 mg total) by mouth at bedtime. (Patient not taking: Reported on 01/17/2021)   No facility-administered encounter medications on file as of 01/17/2021.    ALLERGIES:  VACCINATION STATUS:  There is no immunization history on file for this patient.   HPI   Krystal Roberson  is a patient with the above medical history. she was diagnosed with hypothyroidism after her pregnancy at approximate age of 20 years, which required subsequent initiation of thyroid hormone replacement. she was given various doses of Levothyroxine and NP Thyroid over the years, currently on 135 mg of NP Thyroid. she reports compliance to this medication:  Taking it daily on empty stomach  with water, separated by >30 minutes before breakfast and other medications , and by at least 4 hours from calcium, iron, PPIs, multivitamins .  I reviewed patient's thyroid tests:  Lab Results  Component Value Date   TSH 0.38 (L) 01/04/2020   TSH 0.012  (L) 08/07/2019   TSH <0.01 (L) 07/27/2019   TSH 0.18 (L) 05/21/2018   TSH 18.06 (H) 05/12/2017   TSH 23.61 (H) 06/11/2016   TSH 1.76 08/09/2015   TSH 2.69 12/05/2014   TSH 9.37 (H) 07/13/2014   TSH 4.000 03/24/2014   FREET4 1.50 07/27/2019   FREET4 1.12 05/21/2018   FREET4 0.59 (L) 05/12/2017   FREET4 0.56 (L) 06/11/2016   FREET4 0.89 08/09/2015   FREET4 1.09 12/05/2014   FREET4 0.78 11/04/2011   FREET4 0.71 07/18/2010     Pt describes: - weight gain - fatigue  Pt denies feeling nodules in neck, hoarseness, dysphagia/odynophagia, SOB with lying down.  she does have family history of thyroid disorders in her mothers side of the family (all hypothyroidism).  No family history of thyroid cancer. No history of radiation therapy to head or neck.  No recent use of iodine supplements.  Denies use of Biotin containing supplements.  I reviewed her chart and she also has a history of prediabetes, migraines, and asthma.   ROS:  Constitutional: + weight gain, + fatigue, no subjective hyperthermia, no subjective hypothermia Eyes: no blurry vision, no xerophthalmia ENT: no sore throat, no nodules palpated in throat, no dysphagia/odynophagia, no hoarseness Cardiovascular: no chest pain, no SOB, no palpitations, no leg swelling Respiratory: no cough, no SOB Gastrointestinal: no nausea/vomiting/diarrhea Musculoskeletal: no muscle/joint aches Skin: no rashes Neurological: no tremors, no numbness, no tingling, no dizziness Psychiatric: no depression, no anxiety   Objective:   Objective     BP 128/80   Pulse 80   Ht 5\' 6"  (1.676 m)   Wt 289 lb 6.4 oz (131.3 kg)   BMI 46.71 kg/m  Wt Readings from Last 3 Encounters:  01/17/21 289 lb 6.4 oz (131.3 kg)  10/18/20 274 lb 3.2 oz (124.4 kg)  06/27/20 270 lb (122.5 kg)    BP Readings from Last 3 Encounters:  01/17/21 128/80  10/18/20 130/71  06/27/20 (!) 140/100     Constitutional:  Body mass index is 46.71 kg/m., not in acute  distress, normal state  of mind Eyes: PERRLA, EOMI, no exophthalmos ENT: moist mucous membranes, no thyromegaly, no cervical lymphadenopathy Cardiovascular: normal precordial activity, RRR, no murmur/rubs/gallops Respiratory:  adequate breathing efforts, no gross chest deformity, Clear to auscultation bilaterally Gastrointestinal: abdomen soft, non-tender, no distension, bowel sounds present Musculoskeletal: no gross deformities, strength intact in all four extremities Skin: moist, warm, no rashes Neurological: no tremor with outstretched hands, deep tendon reflexes normal in BLE.   CMP ( most recent) CMP     Component Value Date/Time   NA 139 08/06/2019 1532   K 4.3 08/06/2019 1532   CL 104 08/06/2019 1532   CO2 24 08/06/2019 1532   GLUCOSE 98 08/06/2019 1532   GLUCOSE 85 05/23/2006 1243   BUN 15 08/06/2019 1532   CREATININE 0.62 08/06/2019 1532   CALCIUM 9.2 08/06/2019 1532   PROT 7.2 08/06/2019 1532   ALBUMIN 3.8 08/06/2019 1532   AST 17 08/06/2019 1532   ALT 19 08/06/2019 1532   ALKPHOS 90 08/06/2019 1532   BILITOT 0.7 08/06/2019 1532   GFRNONAA >60 08/06/2019 1532   GFRAA >60 08/06/2019 1532     Diabetic Labs (most recent): Lab Results  Component Value Date   HGBA1C 5.8 (H) 06/27/2020     Lipid Panel ( most recent) Lipid Panel     Component Value Date/Time   CHOL 139 05/21/2018 0934   TRIG 95.0 05/21/2018 0934   HDL 32.30 (L) 05/21/2018 0934   CHOLHDL 4 05/21/2018 0934   VLDL 19.0 05/21/2018 0934   LDLCALC 88 05/21/2018 0934       Lab Results  Component Value Date   TSH 0.38 (L) 01/04/2020   TSH 0.012 (L) 08/07/2019   TSH <0.01 (L) 07/27/2019   TSH 0.18 (L) 05/21/2018   TSH 18.06 (H) 05/12/2017   TSH 23.61 (H) 06/11/2016   TSH 1.76 08/09/2015   TSH 2.69 12/05/2014   TSH 9.37 (H) 07/13/2014   TSH 4.000 03/24/2014   FREET4 1.50 07/27/2019   FREET4 1.12 05/21/2018   FREET4 0.59 (L) 05/12/2017   FREET4 0.56 (L) 06/11/2016   FREET4 0.89 08/09/2015    FREET4 1.09 12/05/2014   FREET4 0.78 11/04/2011   FREET4 0.71 07/18/2010      Assessment & Plan:   ASSESSMENT / PLAN:  1. Hypothyroidism-likely r/t Hashimoto's thyroiditis   Patient with long-standing hypothyroidism, on NP thyroid therapy. On physical exam, patient  does not have gross goiter, thyroid nodules, or neck compression symptoms.  I discussed switching her back to Levothyroxine as it has many more dosage options than NP thyroid and typically is easier to dose based on thyroid labs and she agreed.  I discussed and initiated Levothyroxine 188 mcg (1 100 mcg and 1 88 mcg tabs po daily before breakfast) which is close to her max dose of thyroid hormone replacement based on her weight.  - We discussed about correct intake of levothyroxine, at fasting, with water, separated by at least 30 minutes from breakfast, and separated by more than 4 hours from calcium, iron, multivitamins, acid reflux medications (PPIs). -Patient is made aware of the fact that thyroid hormone replacement is needed for life, dose to be adjusted by periodic monitoring of thyroid function tests.  - Will check thyroid tests today including TSH, FT4, and antibody testing for autoimmune thyroid dysfunction.  Will also check TSH, free T4 before next visit to monitor the effectiveness of the dose.  -Due to absence of clinical goiter, no need for thyroid ultrasound.  Will consider ordering one on  subsequent visits to assess baseline thyroid anatomy.   - Time spent with the patient: 45 minutes, of which >50% was spent in obtaining information about her symptoms, reviewing her previous labs, evaluations, and treatments, counseling her about her hypothyroidism, and developing a plan to confirm the diagnosis and long term treatment as necessary. Please refer to "Patient Self Inventory" in the Media tab for reviewed elements of pertinent patient history.  Rica Mote participated in the discussions, expressed  understanding, and voiced agreement with the above plans.  All questions were answered to her satisfaction. she is encouraged to contact clinic should she have any questions or concerns prior to her return visit.   FOLLOW UP PLAN:  Return in about 2 months (around 03/19/2021) for Thyroid follow up, Previsit labs.  Ronny Bacon, [facility] [address] [place], [place] [address] Phone: [phone] Fax: [phone]  01/17/2021, 11:33 AM

## 2021-01-18 LAB — THYROGLOBULIN ANTIBODY: Thyroglobulin Antibody: <1 IU/mL (ref 0.0–0.9)

## 2021-01-18 LAB — THYROID PEROXIDASE ANTIBODY: Thyroperoxidase Ab SerPl-aCnc: 14 IU/mL (ref 0–34)

## 2021-01-18 LAB — T4, FREE: Free T4: 0.89 ng/dL (ref 0.82–1.77)

## 2021-01-18 LAB — TSH: TSH: 1.03 u[IU]/mL (ref 0.450–4.500)

## 2021-03-19 ENCOUNTER — Ambulatory Visit: Payer: Self-pay | Admitting: Nurse Practitioner

## 2021-03-19 NOTE — Patient Instructions (Incomplete)

## 2021-06-15 DIAGNOSIS — F4381 Prolonged grief disorder: Secondary | ICD-10-CM | POA: Diagnosis not present

## 2021-06-22 DIAGNOSIS — F4381 Prolonged grief disorder: Secondary | ICD-10-CM | POA: Diagnosis not present

## 2021-06-25 ENCOUNTER — Institutional Professional Consult (permissible substitution): Payer: Self-pay | Admitting: Plastic Surgery

## 2021-06-29 ENCOUNTER — Ambulatory Visit: Payer: BC Managed Care – PPO | Admitting: Plastic Surgery

## 2021-06-29 ENCOUNTER — Other Ambulatory Visit: Payer: Self-pay

## 2021-06-29 ENCOUNTER — Encounter: Payer: Self-pay | Admitting: Plastic Surgery

## 2021-06-29 VITALS — BP 133/86 | HR 92 | Ht 65.0 in | Wt 280.0 lb

## 2021-06-29 DIAGNOSIS — H02403 Unspecified ptosis of bilateral eyelids: Secondary | ICD-10-CM

## 2021-06-29 DIAGNOSIS — F4381 Prolonged grief disorder: Secondary | ICD-10-CM | POA: Diagnosis not present

## 2021-06-29 DIAGNOSIS — H02834 Dermatochalasis of left upper eyelid: Secondary | ICD-10-CM | POA: Diagnosis not present

## 2021-06-29 DIAGNOSIS — H02831 Dermatochalasis of right upper eyelid: Secondary | ICD-10-CM

## 2021-07-01 NOTE — Progress Notes (Signed)
Referring Provider No referring provider defined for this encounter.   CC:  Drooping left eyelid   DELAYNE SANZO is an 46 y.o. female.  HPI: Patient is a 46 year old who has a history of surgery on her eyelids.  On the right side she had apparently ptosis surgery.  She had her first surgery in September 2019 and then another revision in 2020.  Reportedly the surgeries only involved the right eye.  She now notes that her right eye is headed and her left eye is drooping.  She was not able to provide records today but will help [place] get them.  No history of significant dry eye syndrome.  Allergies:  PCN, Zoloft  Outpatient Encounter Medications as of 06/29/2021  Medication Sig   Cholecalciferol (VITAMIN D) 125 MCG (5000 UT) CAPS Take 5,000 Units by mouth daily.    estradiol (ESTRACE) 1 MG tablet Take 1 tablet (1 mg total) by mouth daily. (Patient not taking: Reported on 01/17/2021)   levothyroxine (SYNTHROID) 100 MCG tablet Take 1 tablet (100 mcg total) by mouth daily. Take in addition to 88 mcg tab po daily before breakfast   levothyroxine (SYNTHROID) 88 MCG tablet Take 1 tablet (88 mcg total) by mouth daily before breakfast. Take in addition to 100 mcg po daily before breakfast   metFORMIN (GLUCOPHAGE-XR) 500 MG 24 hr tablet Take 1 tablet (500 mg total) by mouth 2 (two) times daily. (Patient not taking: Reported on 01/17/2021)   montelukast (SINGULAIR) 10 MG tablet Take 1 tablet (10 mg total) by mouth at bedtime. (Patient not taking: Reported on 01/17/2021)   No facility-administered encounter medications on file as of 06/29/2021.     Past Medical History:  Diagnosis Date   Allergy    Asthma    Depression    Headache(784.0)    Migraine    Thyroid disease    UTI (lower urinary tract infection)     Past Surgical History:  Procedure Laterality Date   ABDOMINAL HYSTERECTOMY  04/02/2019   TAH&BSO [facility]-Endometriosis   CESAREAN SECTION     CHOLECYSTECTOMY  03-2014   at [facility]    ENDOMETRIAL ABLATION W/ NOVASURE  2017   per Dr. Cherly Hensen    VAGINAL PROLAPSE REPAIR  12/29/2019    Family History  Problem Relation Age of Onset   Thyroid disease Mother    Endometrial cancer Mother    Diabetes Father    Hypertension Father    COPD Father    Arthritis Other    Cancer Other        breast, 1st degree, less 50   Diabetes Other        1st degree    Hypertension Other    Depression Other    Stroke Other        1st degree, less 50   Coronary artery disease Other     Social History   Social History Narrative   Widow since Nov 2019,married for 21 years.Lives with 2 daughters.     Review of Systems General: Denies fevers, chills, weight loss CV: Denies chest pain, shortness of breath, palpitations   Physical Exam Vitals with BMI 06/29/2021 01/17/2021 10/18/2020  Height 5\' 5"  5\' 6"  5\' 6"   Weight 280 lbs 274 lbs 6 oz 274 lbs 3 oz  BMI 46.59 44.31 44.28  Systolic 133 128  Diastolic 86 80 71  Pulse 92 80 87    General:  No acute distress,  Alert and oriented, Non-Toxic, Normal speech  and affect HEENT: Right eye MRD is 4 mm, left MRD is 1 mm some dermatochalasis, levator excursion 15 mm bilaterally.  Brows below the orbital rim bilaterally  Assessment/Plan I think the patient is likely candidate for left ptosis repair and possible bilateral brow lift.  I explained that I would like to review her previous records before we proceed with surgery.  I also discussed that she may want to consider staying with the same surgeon who knows her history but she decided she would like to make a change.  We discussed that she would get a repeat visual field.  Janne Napoleon 07/01/2021, 10:34 AM

## 2021-07-06 DIAGNOSIS — F4381 Prolonged grief disorder: Secondary | ICD-10-CM | POA: Diagnosis not present

## 2021-07-13 DIAGNOSIS — F4381 Prolonged grief disorder: Secondary | ICD-10-CM | POA: Diagnosis not present

## 2021-07-20 DIAGNOSIS — F4381 Prolonged grief disorder: Secondary | ICD-10-CM | POA: Diagnosis not present

## 2021-08-03 DIAGNOSIS — F4381 Prolonged grief disorder: Secondary | ICD-10-CM | POA: Diagnosis not present

## 2021-08-10 DIAGNOSIS — F4381 Prolonged grief disorder: Secondary | ICD-10-CM | POA: Diagnosis not present

## 2021-08-21 DIAGNOSIS — F4381 Prolonged grief disorder: Secondary | ICD-10-CM | POA: Diagnosis not present

## 2021-08-27 DIAGNOSIS — F4381 Prolonged grief disorder: Secondary | ICD-10-CM | POA: Diagnosis not present

## 2021-09-03 DIAGNOSIS — F4381 Prolonged grief disorder: Secondary | ICD-10-CM | POA: Diagnosis not present

## 2021-09-10 DIAGNOSIS — F4381 Prolonged grief disorder: Secondary | ICD-10-CM | POA: Diagnosis not present

## 2021-09-17 DIAGNOSIS — F4381 Prolonged grief disorder: Secondary | ICD-10-CM | POA: Diagnosis not present

## 2021-09-24 DIAGNOSIS — F4381 Prolonged grief disorder: Secondary | ICD-10-CM | POA: Diagnosis not present

## 2021-10-01 DIAGNOSIS — F4381 Prolonged grief disorder: Secondary | ICD-10-CM | POA: Diagnosis not present

## 2021-10-08 DIAGNOSIS — F4381 Prolonged grief disorder: Secondary | ICD-10-CM | POA: Diagnosis not present

## 2021-10-15 DIAGNOSIS — F4381 Prolonged grief disorder: Secondary | ICD-10-CM | POA: Diagnosis not present

## 2021-10-29 DIAGNOSIS — F4381 Prolonged grief disorder: Secondary | ICD-10-CM | POA: Diagnosis not present

## 2021-11-12 DIAGNOSIS — F4381 Prolonged grief disorder: Secondary | ICD-10-CM | POA: Diagnosis not present

## 2021-11-16 DIAGNOSIS — Z6841 Body Mass Index (BMI) 40.0 and over, adult: Secondary | ICD-10-CM | POA: Diagnosis not present

## 2021-11-16 DIAGNOSIS — H5711 Ocular pain, right eye: Secondary | ICD-10-CM | POA: Diagnosis not present

## 2021-11-19 DIAGNOSIS — F4381 Prolonged grief disorder: Secondary | ICD-10-CM | POA: Diagnosis not present

## 2021-11-19 DIAGNOSIS — H6092 Unspecified otitis externa, left ear: Secondary | ICD-10-CM | POA: Diagnosis not present

## 2021-11-19 DIAGNOSIS — S5001XA Contusion of right elbow, initial encounter: Secondary | ICD-10-CM | POA: Diagnosis not present

## 2021-11-26 DIAGNOSIS — F4381 Prolonged grief disorder: Secondary | ICD-10-CM | POA: Diagnosis not present

## 2021-12-17 DIAGNOSIS — F4381 Prolonged grief disorder: Secondary | ICD-10-CM | POA: Diagnosis not present

## 2021-12-25 DIAGNOSIS — F4381 Prolonged grief disorder: Secondary | ICD-10-CM | POA: Diagnosis not present

## 2021-12-31 DIAGNOSIS — F4381 Prolonged grief disorder: Secondary | ICD-10-CM | POA: Diagnosis not present

## 2022-01-07 DIAGNOSIS — F4381 Prolonged grief disorder: Secondary | ICD-10-CM | POA: Diagnosis not present

## 2022-01-14 DIAGNOSIS — F4381 Prolonged grief disorder: Secondary | ICD-10-CM | POA: Diagnosis not present

## 2022-01-18 ENCOUNTER — Ambulatory Visit: Payer: BC Managed Care – PPO | Admitting: Family Medicine

## 2022-01-21 DIAGNOSIS — F4381 Prolonged grief disorder: Secondary | ICD-10-CM | POA: Diagnosis not present

## 2022-01-22 ENCOUNTER — Encounter: Payer: Self-pay | Admitting: Family Medicine

## 2022-01-22 ENCOUNTER — Ambulatory Visit (INDEPENDENT_AMBULATORY_CARE_PROVIDER_SITE_OTHER): Payer: BC Managed Care – PPO | Admitting: Family Medicine

## 2022-01-22 VITALS — BP 128/80 | HR 101 | Temp 98.3°F | Wt 289.0 lb

## 2022-01-22 DIAGNOSIS — E039 Hypothyroidism, unspecified: Secondary | ICD-10-CM | POA: Diagnosis not present

## 2022-01-22 DIAGNOSIS — Z0000 Encounter for general adult medical examination without abnormal findings: Secondary | ICD-10-CM | POA: Diagnosis not present

## 2022-01-22 DIAGNOSIS — M791 Myalgia, unspecified site: Secondary | ICD-10-CM

## 2022-01-22 LAB — HEPATIC FUNCTION PANEL
ALT: 23 U/L (ref 0–35)
AST: 18 U/L (ref 0–37)
Albumin: 4 g/dL (ref 3.5–5.2)
Alkaline Phosphatase: 140 U/L — ABNORMAL HIGH (ref 39–117)
Bilirubin, Direct: 0.1 mg/dL (ref 0.0–0.3)
Total Bilirubin: 0.3 mg/dL (ref 0.2–1.2)
Total Protein: 7.8 g/dL (ref 6.0–8.3)

## 2022-01-22 LAB — BASIC METABOLIC PANEL
BUN: 21 mg/dL (ref 6–23)
CO2: 27 mEq/L (ref 19–32)
Calcium: 9.5 mg/dL (ref 8.4–10.5)
Chloride: 103 mEq/L (ref 96–112)
Creatinine, Ser: 0.74 mg/dL (ref 0.40–1.20)
GFR: 96.13 mL/min (ref >60.00)
Glucose, Bld: 105 mg/dL — ABNORMAL HIGH (ref 70–99)
Potassium: 4 mEq/L (ref 3.5–5.1)
Sodium: 139 mEq/L (ref 135–145)

## 2022-01-22 LAB — CBC WITH DIFFERENTIAL/PLATELET
Basophils Absolute: 0.1 10*3/uL (ref 0.0–0.1)
Basophils Relative: 0.5 % (ref 0.0–3.0)
Eosinophils Absolute: 0.3 10*3/uL (ref 0.0–0.7)
Eosinophils Relative: 2.8 % (ref 0.0–5.0)
HCT: 39.1 % (ref 36.0–46.0)
Hemoglobin: 12.8 g/dL (ref 12.0–15.0)
Lymphocytes Relative: 19.9 % (ref 12.0–46.0)
Lymphs Abs: 2 10*3/uL (ref 0.7–4.0)
MCHC: 32.8 g/dL (ref 30.0–36.0)
MCV: 85.8 fl (ref 78.0–100.0)
Monocytes Absolute: 0.5 10*3/uL (ref 0.1–1.0)
Monocytes Relative: 5.3 % (ref 3.0–12.0)
Neutro Abs: 7.3 10*3/uL (ref 1.4–7.7)
Neutrophils Relative %: 71.5 % (ref 43.0–77.0)
Platelets: 265 10*3/uL (ref 150.0–400.0)
RBC: 4.56 Mil/uL (ref 3.87–5.11)
RDW: 15.1 % (ref 11.5–15.5)
WBC: 10.2 10*3/uL (ref 4.0–10.5)

## 2022-01-22 LAB — HEMOGLOBIN A1C: Hgb A1c MFr Bld: 6.3 % (ref 4.6–6.5)

## 2022-01-22 LAB — T4, FREE: Free T4: 1.24 ng/dL (ref 0.60–1.60)

## 2022-01-22 LAB — SEDIMENTATION RATE: Sed Rate: 51 mm/hr — ABNORMAL HIGH (ref 0–20)

## 2022-01-22 LAB — CK: Total CK: 92 U/L (ref 7–177)

## 2022-01-22 LAB — TSH: TSH: 0.05 u[IU]/mL — ABNORMAL LOW (ref 0.35–5.50)

## 2022-01-22 LAB — T3, FREE: T3, Free: 3.5 pg/mL (ref 2.3–4.2)

## 2022-01-22 LAB — C-REACTIVE PROTEIN: CRP: 1 mg/dL (ref 0.5–20.0)

## 2022-01-22 NOTE — Progress Notes (Signed)
Subjective:    Patient ID: Krystal Roberson, female    DOB: 05/11/1976, 46 y.o.   MRN: [id number]  HPI Here to re-establish after a 2 and 1/2 year interval. She had lost her health insurance in 2020, so she began to see Dr. Lilly Cove for primary care. He also managed her thyroid medications. Unfortunately he passed away earlier this year, so she has come back to [place] for primary care. She now has health insurance again. To recap the past 2 years she was found to have an enlarged right ovary, and she went to surgery on 04-02-19 per her GYN, Dr. Maxie Better. This was a robotic assisted laparoscopic procedure. During this she was found to have extensive endometriosis, so she ended up having a complete hysterectomy with BSO. Then she developed a cystocele with prolapse, so this was surgically repaired on 12-29-19. She is interested in a complete well exam today. She feels tired all the time, and she knows part of this is due to the fact that she has gained a lot of weight in the past 2 years. She also describes a constant aching in all her muscles.    Review of Systems  Constitutional:  Positive for fatigue and unexpected weight change.  HENT: Negative.    Eyes: Negative.   Respiratory: Negative.    Cardiovascular: Negative.   Gastrointestinal: Negative.   Genitourinary:  Negative for decreased urine volume, difficulty urinating, dyspareunia, dysuria, enuresis, flank pain, frequency, hematuria, pelvic pain and urgency.  Musculoskeletal:  Positive for myalgias.  Skin: Negative.   Neurological: Negative.  Negative for headaches.  Psychiatric/Behavioral: Negative.         Objective:   Physical Exam Constitutional:      General: She is not in acute distress.    Appearance: She is well-developed. She is obese.  HENT:     Head: Normocephalic and atraumatic.     Right Ear: External ear normal.     Left Ear: External ear normal.     Nose: Nose normal.     Mouth/Throat:     Pharynx:  No oropharyngeal exudate.  Eyes:     General: No scleral icterus.    Conjunctiva/sclera: Conjunctivae normal.     Pupils: Pupils are equal, round, and reactive to light.  Neck:     Thyroid: No thyromegaly.     Vascular: No JVD.  Cardiovascular:     Rate and Rhythm: Normal rate and regular rhythm.     Heart sounds: Normal heart sounds. No murmur heard.    No friction rub. No gallop.  Pulmonary:     Effort: Pulmonary effort is normal. No respiratory distress.     Breath sounds: Normal breath sounds. No wheezing or rales.  Chest:     Chest wall: No tenderness.  Abdominal:     General: Bowel sounds are normal. There is no distension.     Palpations: Abdomen is soft. There is no mass.     Tenderness: There is no abdominal tenderness. There is no guarding or rebound.  Musculoskeletal:        General: No tenderness. Normal range of motion.     Cervical back: Normal range of motion and neck supple.  Lymphadenopathy:     Cervical: No cervical adenopathy.  Skin:    General: Skin is warm and dry.     Findings: No erythema or rash.  Neurological:     Mental Status: She is alert and oriented to person, place, and time.  Cranial Nerves: No cranial nerve deficit.     Motor: No abnormal muscle tone.     Coordination: Coordination normal.     Deep Tendon Reflexes: Reflexes are normal and symmetric. Reflexes normal.  Psychiatric:        Mood and Affect: Mood normal.        Behavior: Behavior normal.        Thought Content: Thought content normal.        Judgment: Judgment normal.           Assessment & Plan:  Well exam. We discussed diet and exercise. Get labs including a thyroid panel. We also check ESR, CRP, CK, and other inflammatory markers. She is not fasting so we will check lipids at another time.  Alysia Penna, MD

## 2022-01-23 LAB — RHEUMATOID FACTOR: Rheumatoid fact SerPl-aCnc: <14 IU/mL (ref <14)

## 2022-01-25 ENCOUNTER — Telehealth: Payer: Self-pay | Admitting: Family Medicine

## 2022-01-25 ENCOUNTER — Other Ambulatory Visit: Payer: Self-pay

## 2022-01-25 MED ORDER — MELOXICAM 15 MG PO TABS: 15.0000 mg | ORAL_TABLET | Freq: Every day | ORAL | 2 refills | Status: DC

## 2022-01-25 NOTE — Telephone Encounter (Signed)
Spoke with patient about lab results.

## 2022-01-25 NOTE — Telephone Encounter (Signed)
Returning call regarding labs

## 2022-02-03 ENCOUNTER — Other Ambulatory Visit: Payer: Self-pay | Admitting: Nurse Practitioner

## 2022-02-03 DIAGNOSIS — E039 Hypothyroidism, unspecified: Secondary | ICD-10-CM

## 2022-02-04 DIAGNOSIS — F4381 Prolonged grief disorder: Secondary | ICD-10-CM | POA: Diagnosis not present

## 2022-02-11 DIAGNOSIS — F4381 Prolonged grief disorder: Secondary | ICD-10-CM | POA: Diagnosis not present

## 2022-02-18 DIAGNOSIS — F4381 Prolonged grief disorder: Secondary | ICD-10-CM | POA: Diagnosis not present

## 2022-02-25 DIAGNOSIS — F4381 Prolonged grief disorder: Secondary | ICD-10-CM | POA: Diagnosis not present

## 2022-03-04 DIAGNOSIS — F4381 Prolonged grief disorder: Secondary | ICD-10-CM | POA: Diagnosis not present

## 2022-03-11 DIAGNOSIS — F4381 Prolonged grief disorder: Secondary | ICD-10-CM | POA: Diagnosis not present

## 2022-03-18 DIAGNOSIS — Z01419 Encounter for gynecological examination (general) (routine) without abnormal findings: Secondary | ICD-10-CM | POA: Diagnosis not present

## 2022-03-18 DIAGNOSIS — D071 Carcinoma in situ of vulva: Secondary | ICD-10-CM | POA: Diagnosis not present

## 2022-03-18 DIAGNOSIS — F4381 Prolonged grief disorder: Secondary | ICD-10-CM | POA: Diagnosis not present

## 2022-03-18 DIAGNOSIS — Z90710 Acquired absence of both cervix and uterus: Secondary | ICD-10-CM | POA: Diagnosis not present

## 2022-04-12 DIAGNOSIS — F4381 Prolonged grief disorder: Secondary | ICD-10-CM | POA: Diagnosis not present

## 2022-04-15 DIAGNOSIS — F4381 Prolonged grief disorder: Secondary | ICD-10-CM | POA: Diagnosis not present

## 2022-04-29 DIAGNOSIS — F4381 Prolonged grief disorder: Secondary | ICD-10-CM | POA: Diagnosis not present

## 2022-05-20 DIAGNOSIS — F4381 Prolonged grief disorder: Secondary | ICD-10-CM | POA: Diagnosis not present

## 2022-05-27 DIAGNOSIS — F4381 Prolonged grief disorder: Secondary | ICD-10-CM | POA: Diagnosis not present

## 2022-06-02 ENCOUNTER — Other Ambulatory Visit: Payer: Self-pay | Admitting: Nurse Practitioner

## 2022-06-02 DIAGNOSIS — E039 Hypothyroidism, unspecified: Secondary | ICD-10-CM

## 2022-06-03 ENCOUNTER — Other Ambulatory Visit: Payer: Self-pay | Admitting: Nurse Practitioner

## 2022-06-03 DIAGNOSIS — E039 Hypothyroidism, unspecified: Secondary | ICD-10-CM

## 2022-06-03 DIAGNOSIS — F4381 Prolonged grief disorder: Secondary | ICD-10-CM | POA: Diagnosis not present

## 2022-06-10 DIAGNOSIS — F4381 Prolonged grief disorder: Secondary | ICD-10-CM | POA: Diagnosis not present

## 2022-06-17 DIAGNOSIS — F4381 Prolonged grief disorder: Secondary | ICD-10-CM | POA: Diagnosis not present

## 2022-06-29 ENCOUNTER — Other Ambulatory Visit: Payer: Self-pay | Admitting: Nurse Practitioner

## 2022-06-29 DIAGNOSIS — E039 Hypothyroidism, unspecified: Secondary | ICD-10-CM

## 2022-07-04 ENCOUNTER — Encounter: Payer: Self-pay | Admitting: Family Medicine

## 2022-07-04 DIAGNOSIS — E039 Hypothyroidism, unspecified: Secondary | ICD-10-CM

## 2022-07-08 MED ORDER — LEVOTHYROXINE SODIUM 88 MCG PO TABS: ORAL_TABLET | ORAL | 3 refills | Status: DC

## 2022-07-08 MED ORDER — LEVOTHYROXINE SODIUM 100 MCG PO TABS: ORAL_TABLET | ORAL | 3 refills | Status: DC

## 2022-07-08 NOTE — Telephone Encounter (Signed)
I refilled both for one year

## 2022-07-22 DIAGNOSIS — H0279 Other degenerative disorders of eyelid and periocular area: Secondary | ICD-10-CM | POA: Diagnosis not present

## 2022-07-22 DIAGNOSIS — H02831 Dermatochalasis of right upper eyelid: Secondary | ICD-10-CM | POA: Diagnosis not present

## 2022-07-22 DIAGNOSIS — H02423 Myogenic ptosis of bilateral eyelids: Secondary | ICD-10-CM | POA: Diagnosis not present

## 2022-07-22 DIAGNOSIS — H02413 Mechanical ptosis of bilateral eyelids: Secondary | ICD-10-CM | POA: Diagnosis not present

## 2022-07-22 DIAGNOSIS — F4381 Prolonged grief disorder: Secondary | ICD-10-CM | POA: Diagnosis not present

## 2022-07-22 DIAGNOSIS — H02834 Dermatochalasis of left upper eyelid: Secondary | ICD-10-CM | POA: Diagnosis not present

## 2022-07-29 DIAGNOSIS — F4381 Prolonged grief disorder: Secondary | ICD-10-CM | POA: Diagnosis not present

## 2022-08-05 DIAGNOSIS — F4381 Prolonged grief disorder: Secondary | ICD-10-CM | POA: Diagnosis not present

## 2022-08-12 ENCOUNTER — Encounter: Payer: Self-pay | Admitting: Family Medicine

## 2022-08-12 ENCOUNTER — Ambulatory Visit (INDEPENDENT_AMBULATORY_CARE_PROVIDER_SITE_OTHER): Payer: BC Managed Care – PPO | Admitting: Family Medicine

## 2022-08-12 DIAGNOSIS — R739 Hyperglycemia, unspecified: Secondary | ICD-10-CM

## 2022-08-12 LAB — POCT GLYCOSYLATED HEMOGLOBIN (HGB A1C): Hemoglobin A1C: 6.1 % — AB (ref 4.0–5.6)

## 2022-08-12 MED ORDER — OZEMPIC (0.25 OR 0.5 MG/DOSE) 2 MG/3ML ~~LOC~~ SOPN: 0.2500 mg | PEN_INJECTOR | SUBCUTANEOUS | 0 refills | Status: DC

## 2022-08-12 NOTE — Progress Notes (Signed)
   Subjective:    Patient ID: Krystal Roberson, female    DOB: 1976/04/07, 47 y.o.   MRN: [id number]  HPI Here to discuss her weight gain. She has been watching her diet more closely than ever, but she continues to gain weight. She admits that she gets little exercise, but this is difficult to to joint and back pains. She is active on her job at least, and this requires a lot of walking. She has tried metformin a few years ago but this was not successful. She had a well exam with labs here last September. Her A1c today is 6.1%.    Review of Systems  Constitutional: Negative.   Respiratory: Negative.    Cardiovascular: Negative.   Musculoskeletal:  Positive for arthralgias and back pain.       Objective:   Physical Exam Constitutional:      Appearance: She is obese.  Cardiovascular:     Rate and Rhythm: Normal rate and regular rhythm.     Pulses: Normal pulses.     Heart sounds: Normal heart sounds.  Pulmonary:     Effort: Pulmonary effort is normal.     Breath sounds: Normal breath sounds.  Neurological:     Mental Status: She is alert.           Assessment & Plan:  Morbid obesity with prediabetes. We will [facility] to [facility]. She will also try Ozempic 0.25 mg weekly for 4 weeks. Recheck in 4 weeks.  Alysia Penna, MD

## 2022-08-19 DIAGNOSIS — F4381 Prolonged grief disorder: Secondary | ICD-10-CM | POA: Diagnosis not present

## 2022-08-20 ENCOUNTER — Encounter: Payer: Self-pay | Admitting: Family Medicine

## 2022-08-21 NOTE — Telephone Encounter (Signed)
Have her check with her insurance company about what types of weight loss medications they WILL cover

## 2022-08-26 DIAGNOSIS — F4381 Prolonged grief disorder: Secondary | ICD-10-CM | POA: Diagnosis not present

## 2022-09-02 DIAGNOSIS — F4381 Prolonged grief disorder: Secondary | ICD-10-CM | POA: Diagnosis not present

## 2022-09-09 DIAGNOSIS — F4381 Prolonged grief disorder: Secondary | ICD-10-CM | POA: Diagnosis not present

## 2022-09-16 DIAGNOSIS — F4381 Prolonged grief disorder: Secondary | ICD-10-CM | POA: Diagnosis not present

## 2022-09-23 DIAGNOSIS — F4381 Prolonged grief disorder: Secondary | ICD-10-CM | POA: Diagnosis not present

## 2022-09-24 DIAGNOSIS — H02413 Mechanical ptosis of bilateral eyelids: Secondary | ICD-10-CM | POA: Diagnosis not present

## 2022-09-24 DIAGNOSIS — H02423 Myogenic ptosis of bilateral eyelids: Secondary | ICD-10-CM | POA: Diagnosis not present

## 2022-09-30 DIAGNOSIS — F4381 Prolonged grief disorder: Secondary | ICD-10-CM | POA: Diagnosis not present

## 2022-10-14 DIAGNOSIS — F4381 Prolonged grief disorder: Secondary | ICD-10-CM | POA: Diagnosis not present

## 2022-10-21 DIAGNOSIS — F4381 Prolonged grief disorder: Secondary | ICD-10-CM | POA: Diagnosis not present

## 2022-10-28 DIAGNOSIS — F4381 Prolonged grief disorder: Secondary | ICD-10-CM | POA: Diagnosis not present

## 2022-11-04 DIAGNOSIS — F4381 Prolonged grief disorder: Secondary | ICD-10-CM | POA: Diagnosis not present

## 2022-11-25 DIAGNOSIS — F4381 Prolonged grief disorder: Secondary | ICD-10-CM | POA: Diagnosis not present

## 2022-12-16 DIAGNOSIS — F4381 Prolonged grief disorder: Secondary | ICD-10-CM | POA: Diagnosis not present

## 2022-12-30 DIAGNOSIS — F4381 Prolonged grief disorder: Secondary | ICD-10-CM | POA: Diagnosis not present

## 2023-01-06 DIAGNOSIS — F4381 Prolonged grief disorder: Secondary | ICD-10-CM | POA: Diagnosis not present

## 2023-01-11 ENCOUNTER — Emergency Department (HOSPITAL_COMMUNITY): Payer: BC Managed Care – PPO

## 2023-01-11 ENCOUNTER — Other Ambulatory Visit: Payer: Self-pay

## 2023-01-11 ENCOUNTER — Emergency Department (HOSPITAL_COMMUNITY)
Admission: EM | Admit: 2023-01-11 | Discharge: 2023-01-11 | Disposition: A | Discharge status: Home / Self Care | Payer: BC Managed Care – PPO | Source: Home / Self Care

## 2023-01-11 ENCOUNTER — Encounter (HOSPITAL_COMMUNITY): Payer: Self-pay

## 2023-01-11 DIAGNOSIS — E039 Hypothyroidism, unspecified: Secondary | ICD-10-CM | POA: Diagnosis not present

## 2023-01-11 DIAGNOSIS — R519 Headache, unspecified: Secondary | ICD-10-CM | POA: Insufficient documentation

## 2023-01-11 DIAGNOSIS — Z20822 Contact with and (suspected) exposure to covid-19: Secondary | ICD-10-CM | POA: Diagnosis not present

## 2023-01-11 DIAGNOSIS — R531 Weakness: Secondary | ICD-10-CM | POA: Diagnosis not present

## 2023-01-11 DIAGNOSIS — R002 Palpitations: Secondary | ICD-10-CM | POA: Diagnosis not present

## 2023-01-11 DIAGNOSIS — Z79899 Other long term (current) drug therapy: Secondary | ICD-10-CM | POA: Insufficient documentation

## 2023-01-11 DIAGNOSIS — N3000 Acute cystitis without hematuria: Secondary | ICD-10-CM | POA: Diagnosis not present

## 2023-01-11 DIAGNOSIS — R0600 Dyspnea, unspecified: Secondary | ICD-10-CM | POA: Diagnosis not present

## 2023-01-11 DIAGNOSIS — Z794 Long term (current) use of insulin: Secondary | ICD-10-CM | POA: Insufficient documentation

## 2023-01-11 LAB — BASIC METABOLIC PANEL
Anion gap: 9 (ref 5–15)
BUN: 17 mg/dL (ref 6–20)
CO2: 26 mmol/L (ref 22–32)
Calcium: 8.6 mg/dL — ABNORMAL LOW (ref 8.9–10.3)
Chloride: 101 mmol/L (ref 98–111)
Creatinine, Ser: 0.84 mg/dL (ref 0.44–1.00)
GFR, Estimated: >60 mL/min (ref >60)
Glucose, Bld: 159 mg/dL — ABNORMAL HIGH (ref 70–99)
Potassium: 3.9 mmol/L (ref 3.5–5.1)
Sodium: 136 mmol/L (ref 135–145)

## 2023-01-11 LAB — CBC
HCT: 42.6 % (ref 36.0–46.0)
Hemoglobin: 13.8 g/dL (ref 12.0–15.0)
MCH: 27.9 pg (ref 26.0–34.0)
MCHC: 32.4 g/dL (ref 30.0–36.0)
MCV: 86.2 fL (ref 80.0–100.0)
Platelets: 296 10*3/uL (ref 150–400)
RBC: 4.94 MIL/uL (ref 3.87–5.11)
RDW: 14.9 % (ref 11.5–15.5)
WBC: 11 10*3/uL — ABNORMAL HIGH (ref 4.0–10.5)
nRBC: 0 % (ref 0.0–0.2)

## 2023-01-11 LAB — URINALYSIS, ROUTINE W REFLEX MICROSCOPIC
Bilirubin Urine: NEGATIVE
Glucose, UA: NEGATIVE mg/dL
Hgb urine dipstick: NEGATIVE
Ketones, ur: NEGATIVE mg/dL
Nitrite: POSITIVE — AB
Protein, ur: NEGATIVE mg/dL
Specific Gravity, Urine: 1.011 (ref 1.005–1.030)
pH: 5 (ref 5.0–8.0)

## 2023-01-11 LAB — RESP PANEL BY RT-PCR (RSV, FLU A&B, COVID)  RVPGX2
Influenza A by PCR: NEGATIVE
Influenza B by PCR: NEGATIVE
Resp Syncytial Virus by PCR: NEGATIVE
SARS Coronavirus 2 by RT PCR: NEGATIVE

## 2023-01-11 LAB — MAGNESIUM: Magnesium: 2 mg/dL (ref 1.7–2.4)

## 2023-01-11 LAB — TSH: TSH: 11.228 u[IU]/mL — ABNORMAL HIGH (ref 0.350–4.500)

## 2023-01-11 LAB — TROPONIN I (HIGH SENSITIVITY)
Troponin I (High Sensitivity): 27 ng/L — ABNORMAL HIGH (ref <18)
Troponin I (High Sensitivity): 20 ng/L — ABNORMAL HIGH (ref <18)

## 2023-01-11 LAB — T4, FREE: Free T4: 0.77 ng/dL (ref 0.61–1.12)

## 2023-01-11 MED ORDER — CEPHALEXIN 500 MG PO CAPS
500.0000 mg | ORAL_CAPSULE | Freq: Four times a day (QID) | ORAL | 0 refills | Status: AC
Start: 2023-01-11 — End: 2023-01-18

## 2023-01-11 MED ORDER — CEPHALEXIN 250 MG PO CAPS
500.0000 mg | ORAL_CAPSULE | Freq: Once | ORAL | Status: AC
  Administered 2023-01-11: 500 mg via ORAL
  Filled 2023-01-11: qty 2

## 2023-01-11 MED ORDER — KETOROLAC TROMETHAMINE 15 MG/ML IJ SOLN
15.0000 mg | Freq: Once | INTRAMUSCULAR | Status: AC
  Administered 2023-01-11: 15 mg via INTRAVENOUS
  Filled 2023-01-11: qty 1

## 2023-01-11 MED ORDER — DIPHENHYDRAMINE HCL 50 MG/ML IJ SOLN
12.5000 mg | Freq: Once | INTRAMUSCULAR | Status: AC
  Administered 2023-01-11: 12.5 mg via INTRAVENOUS
  Filled 2023-01-11: qty 1

## 2023-01-11 MED ORDER — PROCHLORPERAZINE EDISYLATE 10 MG/2ML IJ SOLN
10.0000 mg | Freq: Once | INTRAMUSCULAR | Status: AC
  Administered 2023-01-11: 10 mg via INTRAVENOUS
  Filled 2023-01-11: qty 2

## 2023-01-11 MED ORDER — SODIUM CHLORIDE 0.9 % IV BOLUS
1000.0000 mL | Freq: Once | INTRAVENOUS | Status: AC
  Administered 2023-01-11: 1000 mL via INTRAVENOUS

## 2023-01-11 NOTE — ED Provider Notes (Signed)
[place] EMERGENCY DEPARTMENT AT [facility] Provider Note   CSN: [id number] Arrival date & time: 01/11/23  1431     History {Add pertinent medical, surgical, social history, OB history to HPI:1} Chief Complaint  Patient presents with   Weakness    Krystal Roberson is a 47 y.o. female.  47 year old female presents with her daughter today for concern of generalized weakness, sensation of feeling "foggy headed", headache, and palpitations.  Denies any recent illness.  Symptoms are intermittent.  Denies prior history of cardiac disease.  Does have history of hypothyroid.  Has been taking levothyroxine as prescribed.  The history is provided by the patient. No language interpreter was used.       Home Medications Prior to Admission medications   Medication Sig Start Date End Date Taking? Authorizing Provider  Cholecalciferol (VITAMIN D) 125 MCG (5000 UT) CAPS Take 5,000 Units by mouth daily.     [provider]  levothyroxine (SYNTHROID) 100 MCG tablet TAKE 1 TABLET BY MOUTH DAILY IN ADDITION TO DAILY BEFORE BREAKFAST 07/08/22   Nelwyn Salisbury, MD  levothyroxine (SYNTHROID) 88 MCG tablet TAKE 1 TABLET BY MOUTH DAILY BEFORE BREAKFAST. TAKE IN ADDITION TO 100 MCG TABLET 07/08/22   Nelwyn Salisbury, MD  meloxicam (MOBIC) 15 MG tablet Take 1 tablet (15 mg total) by mouth daily. Patient not taking: Reported on 08/12/2022 01/25/22   Nelwyn Salisbury, MD  Semaglutide,0.25 or 0.5MG /DOS, (OZEMPIC, 0.25 OR 0.5 MG/DOSE,) 2 MG/3ML SOPN Inject 0.25 mg into the skin once a week. 08/12/22   Nelwyn Salisbury, MD      Allergies    Penicillins and Zoloft [sertraline]    Review of Systems   Review of Systems  Constitutional:  Positive for fatigue. Negative for chills and fever.  Respiratory:  Negative for shortness of breath.   Cardiovascular:  Positive for palpitations. Negative for chest pain.  Neurological:  Positive for numbness and headaches. Negative for light-headedness.   All other systems reviewed and are negative.   Physical Exam Updated Vital Signs BP 122/83 (BP Location: Right Arm)   Pulse (!) 116   Temp 98.3 F (36.8 C) (Oral)   Resp 20   Ht 5\' 5"  (1.651 m)   Wt 131.5 kg   SpO2 98%   BMI 48.26 kg/m  Physical Exam Vitals and nursing note reviewed.  Constitutional:      General: She is not in acute distress.    Appearance: Normal appearance. She is not ill-appearing.  HENT:     Head: Normocephalic and atraumatic.     Nose: Nose normal.  Eyes:     General: No scleral icterus.    Extraocular Movements: Extraocular movements intact.     Conjunctiva/sclera: Conjunctivae normal.  Cardiovascular:     Rate and Rhythm: Normal rate and regular rhythm.     Heart sounds: Normal heart sounds.     Comments: Initially tachycardic.  Normal rate during my exam Pulmonary:     Effort: Pulmonary effort is normal. No respiratory distress.     Breath sounds: Normal breath sounds. No wheezing or rales.  Abdominal:     General: There is no distension.     Tenderness: There is no abdominal tenderness.  Musculoskeletal:        General: Normal range of motion.     Cervical back: Normal range of motion.  Skin:    General: Skin is warm and dry.  Neurological:     General: No focal  deficit present.     Mental Status: She is alert. Mental status is at baseline.     ED Results / Procedures / Treatments   Labs (all labs ordered are listed, but only abnormal results are displayed) Labs Reviewed  BASIC METABOLIC PANEL - Abnormal; Notable for the following components:      Result Value   Glucose, Bld 159 (*)    Calcium 8.6 (*)    All other components within normal limits  CBC - Abnormal; Notable for the following components:   WBC 11.0 (*)    All other components within normal limits  RESP PANEL BY RT-PCR (RSV, FLU A&B, COVID)  RVPGX2  URINALYSIS, ROUTINE W REFLEX MICROSCOPIC  MAGNESIUM  TSH  TROPONIN I (HIGH SENSITIVITY)    EKG EKG  Interpretation Date/Time:  Saturday January 11 2023 14:25:12 EDT Ventricular Rate:  117 PR Interval:  136 QRS Duration:  74 QT Interval:  330 QTC Calculation: 460 R Axis:   20  Text Interpretation: Sinus tachycardia Otherwise normal ECG When compared with ECG of 08-Apr-2015 06:39, PREVIOUS ECG IS PRESENT Confirmed by Estanislado Pandy [phone]) on 01/11/2023 4:24:05 PM  Radiology No results found.  Procedures Procedures  {Document cardiac monitor, telemetry assessment procedure when appropriate:1}  Medications Ordered in ED Medications  sodium chloride 0.9 % bolus 1,000 mL (has no administration in time range)  ketorolac (TORADOL) 15 MG/ML injection 15 mg (has no administration in time range)  prochlorperazine (COMPAZINE) injection 10 mg (has no administration in time range)  diphenhydrAMINE (BENADRYL) injection 12.5 mg (has no administration in time range)    ED Course/ Medical Decision Making/ A&P   {   Click here for ABCD2, HEART and other calculatorsREFRESH Note before signing :1}                              Medical Decision Making Amount and/or Complexity of Data Reviewed Labs: ordered. Radiology: ordered.  Risk Prescription drug management.   ***  {Document critical care time when appropriate:1} {Document review of labs and clinical decision tools ie heart score, Chads2Vasc2 etc:1}  {Document your independent review of radiology images, and any outside records:1} {Document your discussion with family members, caretakers, and with consultants:1} {Document social determinants of health affecting pt's care:1} {Document your decision making why or why not admission, treatments were needed:1} Final Clinical Impression(s) / ED Diagnoses Final diagnoses:  None    Rx / DC Orders ED Discharge Orders     None

## 2023-01-11 NOTE — Discharge Instructions (Signed)
Your workup today shows you have a urinary tract infection.  No concerning rhythms noted on heart monitor.  I have given you referral to cardiology for the palpitations.  Have given you referral to neurology for your weakness, and headache.  If any concerning symptoms return to the emergency room.

## 2023-01-11 NOTE — ED Triage Notes (Signed)
Pt reports generalized weakness for the past 3 days, pt also having palpitations and feeling foggy headed.

## 2023-01-24 ENCOUNTER — Telehealth: Payer: Self-pay

## 2023-01-24 NOTE — Telephone Encounter (Signed)
Transition Care Management Unsuccessful Follow-up Telephone Call  Date of discharge and from where:  [name] 8/31  Attempts:  1st Attempt  Reason for unsuccessful TCM follow-up call:  No answer/busy   Lenard Forth [place]  [facility], [facility] Guide, Phone: [phone] Website: [email or web address]

## 2023-01-27 ENCOUNTER — Telehealth: Payer: Self-pay

## 2023-01-27 DIAGNOSIS — F4381 Prolonged grief disorder: Secondary | ICD-10-CM | POA: Diagnosis not present

## 2023-01-27 NOTE — Telephone Encounter (Signed)
Transition Care Management Unsuccessful Follow-up Telephone Call  Date of discharge and from where:  [name] 8/31  Attempts:  2nd Attempt  Reason for unsuccessful TCM follow-up call:  No answer/busy   [name]  [facility], [facility] Guide, Phone: [phone] Website: [email or web address]

## 2023-02-10 DIAGNOSIS — F4381 Prolonged grief disorder: Secondary | ICD-10-CM | POA: Diagnosis not present

## 2023-02-12 DIAGNOSIS — R079 Chest pain, unspecified: Secondary | ICD-10-CM | POA: Diagnosis not present

## 2023-02-12 DIAGNOSIS — I4892 Unspecified atrial flutter: Secondary | ICD-10-CM | POA: Diagnosis not present

## 2023-02-12 DIAGNOSIS — F32A Depression, unspecified: Secondary | ICD-10-CM | POA: Diagnosis not present

## 2023-02-12 DIAGNOSIS — Z79890 Hormone replacement therapy (postmenopausal): Secondary | ICD-10-CM | POA: Diagnosis not present

## 2023-02-12 DIAGNOSIS — Z79899 Other long term (current) drug therapy: Secondary | ICD-10-CM | POA: Diagnosis not present

## 2023-02-12 DIAGNOSIS — E119 Type 2 diabetes mellitus without complications: Secondary | ICD-10-CM | POA: Diagnosis not present

## 2023-02-12 DIAGNOSIS — I471 Supraventricular tachycardia, unspecified: Secondary | ICD-10-CM | POA: Diagnosis not present

## 2023-02-12 DIAGNOSIS — E039 Hypothyroidism, unspecified: Secondary | ICD-10-CM | POA: Diagnosis not present

## 2023-02-12 DIAGNOSIS — G43909 Migraine, unspecified, not intractable, without status migrainosus: Secondary | ICD-10-CM | POA: Diagnosis not present

## 2023-02-12 DIAGNOSIS — R55 Syncope and collapse: Secondary | ICD-10-CM | POA: Diagnosis not present

## 2023-02-12 DIAGNOSIS — I517 Cardiomegaly: Secondary | ICD-10-CM | POA: Diagnosis not present

## 2023-02-16 DIAGNOSIS — I499 Cardiac arrhythmia, unspecified: Secondary | ICD-10-CM | POA: Diagnosis not present

## 2023-02-17 ENCOUNTER — Telehealth: Payer: Self-pay

## 2023-02-17 DIAGNOSIS — F4381 Prolonged grief disorder: Secondary | ICD-10-CM | POA: Diagnosis not present

## 2023-02-17 NOTE — Transitions of Care (Post Inpatient/ED Visit) (Signed)
   02/17/2023  Name: CHARDA JANIS MRN: [id number] DOB: 1975-07-06  Today's TOC FU Call Status: Today's TOC FU Call Status:: Unsuccessful Call (1st Attempt) Unsuccessful Call (1st Attempt) Date: 02/17/23  Attempted to reach the patient regarding the most recent Inpatient/ED visit.  Follow Up Plan: Additional outreach attempts will be made to reach the patient to complete the Transitions of Care (Post Inpatient/ED visit) call.     Antionette Fairy, RN,BSN,CCM RN Care Manager Transitions of Care  [facility]  Direct Phone: [phone] Toll Free: [phone] Fax: [phone]

## 2023-02-17 NOTE — Transitions of Care (Post Inpatient/ED Visit) (Signed)
02/17/2023  Name: Krystal Roberson MRN: [id number] DOB: [birth year]  Today's TOC FU Call Status: Today's TOC FU Call Status:: Successful TOC FU Call Completed TOC FU Call Complete Date: 02/17/23 (Voicemail message received from pt. Return call to pt.) Patient's Name and Date of Birth confirmed.  Transition Care Management Follow-up Telephone Call Date of Discharge: 02/14/23 Discharge Facility: Other ([facility]) Name of Other ([facility]) Discharge Facility: [facility] Type of Discharge: Inpatient Admission Primary Inpatient Discharge Diagnosis:: "atrial flutter,unspecified" How have you been since you were released from the hospital?: Better (pt voices she is feeling better-still " a little tired at times-taking it easy." Appetite good-no issues with elimination-denies any pain,CP or discomfort) Any questions or concerns?: No  Items Reviewed: Did you receive and understand the discharge instructions provided?: Yes Medications obtained,verified, and reconciled?: Yes (Medications Reviewed) (pt states Md from hosptial called her today and advised her to stop taking ASA that she was recently started on at discharge due some lab works values and she has stopped taking med) Any new allergies since your discharge?: No Dietary orders reviewed?: Yes Type of Diet Ordered:: low salt/heart healthy Do you have support at home?: Yes People in Home: alone Name of Support/Comfort Primary Source: family/friends able to assist if needed  Medications Reviewed Today: Medications Reviewed Today     Reviewed by Charlyn Minerva, RN (Registered Nurse) on 02/17/23 at 1433  Med List Status: <None>   Medication Order Taking? Sig Documenting Provider Last Dose Status Informant  atorvastatin (LIPITOR) 40 MG tablet [id number] Yes Take 40 mg by mouth daily. [provider] Taking Active Self  Cholecalciferol (VITAMIN D) 125 MCG (5000 UT) CAPS [id number] Yes Take 5,000 Units by mouth daily.   [provider] Taking Active   diltiazem (CARDIZEM) 120 MG tablet [id number] Yes Take 120 mg by mouth daily. [provider] Taking Active Self  levothyroxine (SYNTHROID) 100 MCG tablet [id number] Yes TAKE 1 TABLET BY MOUTH DAILY IN ADDITION TO DAILY BEFORE BREAKFAST  Patient taking differently: TAKE 1 TABLET BY MOUTH DAILY   Nelwyn Salisbury, MD Taking Active Self  levothyroxine (SYNTHROID) 88 MCG tablet [id number] No TAKE 1 TABLET BY MOUTH DAILY BEFORE BREAKFAST. TAKE IN ADDITION TO 100 MCG TABLET  Patient not taking: Reported on 02/17/2023   Nelwyn Salisbury, MD Not Taking Active   meloxicam (MOBIC) 15 MG tablet [id number] No Take 1 tablet (15 mg total) by mouth daily.  Patient not taking: Reported on 08/12/2022   Nelwyn Salisbury, MD Not Taking Active   Semaglutide,0.25 or 0.5MG /DOS, (OZEMPIC, 0.25 OR 0.5 MG/DOSE,) 2 MG/3ML SOPN [id number] No Inject 0.25 mg into the skin once a week.  Patient not taking: Reported on 02/17/2023   Nelwyn Salisbury, MD Not Taking Active             Home Care and Equipment/Supplies: Were Home Health Services Ordered?: NA Any new equipment or medical supplies ordered?: NA  Functional Questionnaire: Do you need assistance with bathing/showering or dressing?: No Do you need assistance with meal preparation?: No Do you need assistance with eating?: No Do you have difficulty maintaining continence: No Do you need assistance with getting out of bed/getting out of a chair/moving?: No Do you have difficulty managing or taking your medications?: No  Follow up appointments reviewed: PCP Follow-up appointment confirmed?: Yes Date of PCP follow-up appointment?: 02/24/23 Follow-up Provider: Dr. Clent Ridges [facility] Follow-up appointment confirmed?: Yes Date of Specialist follow-up appointment?: 03/18/23 Follow-Up Specialty Provider:: Dr. Odis Hollingshead  Do you need transportation to your follow-up appointment?: No (pt confirms she is able to get to  appts) Do you understand care options if your condition(s) worsen?: Yes-patient verbalized understanding  SDOH Interventions Today    Flowsheet Row Most Recent Value  SDOH Interventions   Food Insecurity Interventions Intervention Not Indicated  Transportation Interventions Intervention Not Indicated      TOC Interventions Today    Flowsheet Row Most Recent Value  TOC Interventions   TOC Interventions Discussed/Reviewed TOC Interventions Discussed, Post discharge activity limitations per provider      Interventions Today    Flowsheet Row Most Recent Value  Chronic Disease   Chronic disease during today's visit Atrial Fibrillation (AFib)  General Interventions   General Interventions Discussed/Reviewed General Interventions Discussed, Doctor Visits, Referral to Nurse  [pt independent with ADLs/IADLs, knowledegable and able to manage condition-doesn't need ongoing RN calls at this time]  Doctor Visits Discussed/Reviewed Doctor Visits Discussed, Specialist, PCP  PCP/Specialist Visits Compliance with follow-up visit  Education Interventions   Education Provided Provided Education  Provided Verbal Education On Nutrition, When to see the doctor, Medication, Other  [sx mgmt]  Nutrition Interventions   Nutrition Discussed/Reviewed Nutrition Discussed  Pharmacy Interventions   Pharmacy Dicussed/Reviewed Pharmacy Topics Discussed, Medications and their functions  Safety Interventions   Safety Discussed/Reviewed Safety Discussed       Antionette Fairy, RN,BSN,CCM RN Care Manager Transitions of Care  [place]  Direct Phone: [phone] Toll Free: [phone] Fax: [phone]

## 2023-02-24 ENCOUNTER — Ambulatory Visit: Payer: BC Managed Care – PPO | Admitting: Family Medicine

## 2023-02-24 ENCOUNTER — Encounter: Payer: Self-pay | Admitting: Family Medicine

## 2023-02-24 VITALS — BP 118/80 | HR 84 | Temp 98.9°F | Wt 310.0 lb

## 2023-02-24 DIAGNOSIS — F4381 Prolonged grief disorder: Secondary | ICD-10-CM | POA: Diagnosis not present

## 2023-02-24 DIAGNOSIS — I483 Typical atrial flutter: Secondary | ICD-10-CM | POA: Diagnosis not present

## 2023-02-24 DIAGNOSIS — I4892 Unspecified atrial flutter: Secondary | ICD-10-CM | POA: Insufficient documentation

## 2023-02-24 MED ORDER — OZEMPIC (0.25 OR 0.5 MG/DOSE) 2 MG/3ML ~~LOC~~ SOPN: 0.2500 mg | PEN_INJECTOR | SUBCUTANEOUS | 5 refills | Status: DC

## 2023-02-24 MED ORDER — ASPIRIN 81 MG PO TBEC: 81.0000 mg | DELAYED_RELEASE_TABLET | Freq: Every day | ORAL | 0 refills | Status: DC

## 2023-02-24 MED ORDER — LEVOTHYROXINE SODIUM 200 MCG PO TABS: 200.0000 ug | ORAL_TABLET | Freq: Every day | ORAL | 3 refills | Status: DC

## 2023-02-24 NOTE — Progress Notes (Signed)
   Subjective:    Patient ID: Krystal Roberson, female    DOB: Feb 26, 1976, 47 y.o.   MRN: [id number]  HPI Here to follow up a hospital stay at [facility] from 02-12-23 to 02-14-23 after she presented with palpitations, near syncope, and SOB. She was found to be in atrial flutter with RVR of 150-160. She was given IV Adenosine and Diltiazem, and the rate was brought down to goal. Her labs were significant for a TSH of 14.6, free T4 of 0.7, BNP 22, creatinine 0.76, and Hgb 12.8. Her Levothyroxine dose was increased to 200 mcg daily. An ECHO showed her EF to be 60-65%. A coronary CTA was completely clear, and the calcium score was zero. She was sent home on Diltiazem 120 mg daily, Atorvastatin 40 mg daily, and ASA 81 mg daily. She is wearing a 14 day Zio patch. She feels completely back to normal, and she is working full time. She asks if we can try to prescribe her Ozempic again to help lose weight. We tried this in April, but her insurance company would not cover it.    Review of Systems  Constitutional: Negative.   Respiratory: Negative.    Cardiovascular: Negative.   Neurological: Negative.        Objective:   Physical Exam Constitutional:      Appearance: She is obese. She is not ill-appearing.  Cardiovascular:     Rate and Rhythm: Normal rate and regular rhythm.     Pulses: Normal pulses.     Heart sounds: Normal heart sounds.  Pulmonary:     Effort: Pulmonary effort is normal.     Breath sounds: Normal breath sounds.  Musculoskeletal:     Right lower leg: No edema.     Left lower leg: No edema.  Neurological:     General: No focal deficit present.     Mental Status: She is alert and oriented to person, place, and time.           Assessment & Plan:  She has recovered from a bout of atrial flutter, and her BP and heart rate are well controlled. She is wearing the Zio patch, and we will await that report. She will follow up with [facility] on 03-20-23. We will try to  prescribe her Ozempic again, and we can now include the fact that she has a cardiac condition that is greatly affected by her obesity. We will recheck her thyroid levels in 60 days. We spent a total of ( 35  ) minutes reviewing records and discussing these issues.  Gershon Crane, MD

## 2023-03-03 ENCOUNTER — Other Ambulatory Visit (HOSPITAL_COMMUNITY): Payer: Self-pay

## 2023-03-03 ENCOUNTER — Telehealth: Payer: Self-pay | Admitting: Pharmacist

## 2023-03-03 DIAGNOSIS — F4381 Prolonged grief disorder: Secondary | ICD-10-CM | POA: Diagnosis not present

## 2023-03-03 NOTE — Telephone Encounter (Signed)
Pharmacy Patient Advocate Encounter   Received notification from CoverMyMeds that prior authorization for Ozempic (0.25 or 0.5 MG/DOSE) 2MG /3ML pen-injectors is required/requested.   Insurance verification completed.   The patient is insured through [facility] .   Per test claim: PA required; PA submitted to [employer] via CoverMyMeds Key/confirmation #/EOC [id number] Status is pending

## 2023-03-05 NOTE — Telephone Encounter (Signed)
Pharmacy Patient Advocate Encounter  Received notification from [facility] that Prior Authorization for Ozempic (0.25 or 0.5 MG/DOSE) 2MG /3ML pen-injectors has been DENIED.  See denial reason below. No denial letter attached in CMM. Will attache denial letter to Media tab once received.   PA #/Case ID/Reference #: [id number]

## 2023-03-07 NOTE — Telephone Encounter (Signed)
Noted  

## 2023-03-10 DIAGNOSIS — F4381 Prolonged grief disorder: Secondary | ICD-10-CM | POA: Diagnosis not present

## 2023-03-17 DIAGNOSIS — F4381 Prolonged grief disorder: Secondary | ICD-10-CM | POA: Diagnosis not present

## 2023-03-18 ENCOUNTER — Ambulatory Visit: Payer: BC Managed Care – PPO | Admitting: Cardiology

## 2023-03-18 ENCOUNTER — Encounter: Payer: Self-pay | Admitting: Family Medicine

## 2023-03-18 ENCOUNTER — Ambulatory Visit (INDEPENDENT_AMBULATORY_CARE_PROVIDER_SITE_OTHER): Payer: BC Managed Care – PPO | Admitting: Family Medicine

## 2023-03-18 VITALS — BP 120/78 | HR 89 | Temp 98.7°F | Wt 312.0 lb

## 2023-03-18 DIAGNOSIS — K625 Hemorrhage of anus and rectum: Secondary | ICD-10-CM | POA: Diagnosis not present

## 2023-03-18 NOTE — Progress Notes (Signed)
   Subjective:    Patient ID: Krystal Roberson, female    DOB: 05-Apr-1976, 47 y.o.   MRN: [id number]  HPI Here for 2 weeks of seeing a little bright red blood per rectum. Her stools are not hard and are not painful to pass. No abdominal pain or fever or nausea.    Review of Systems  Constitutional: Negative.   Respiratory: Negative.    Cardiovascular: Negative.   Gastrointestinal:  Positive for anal bleeding. Negative for abdominal distention, abdominal pain, blood in stool, constipation, diarrhea, nausea, rectal pain and vomiting.       Objective:   Physical Exam Constitutional:      Appearance: Normal appearance.  Cardiovascular:     Rate and Rhythm: Normal rate and regular rhythm.     Pulses: Normal pulses.     Heart sounds: Normal heart sounds.  Pulmonary:     Effort: Pulmonary effort is normal.     Breath sounds: Normal breath sounds.  Abdominal:     General: Abdomen is flat. Bowel sounds are normal. There is no distension.     Palpations: Abdomen is soft. There is no mass.     Tenderness: There is no abdominal tenderness. There is no guarding or rebound.     Hernia: No hernia is present.  Genitourinary:    Rectum: Normal.  Neurological:     Mental Status: She is alert.           Assessment & Plan:  Passing bright red blood per rectum. A likely etiology is bleeding internal hemorrhoids. I advised her to drink water and to keep the stools as soft as possible. Refer to GI.  Gershon Crane, MD

## 2023-03-20 DIAGNOSIS — R5383 Other fatigue: Secondary | ICD-10-CM | POA: Diagnosis not present

## 2023-03-20 DIAGNOSIS — I4892 Unspecified atrial flutter: Secondary | ICD-10-CM | POA: Diagnosis not present

## 2023-03-20 DIAGNOSIS — R0683 Snoring: Secondary | ICD-10-CM | POA: Diagnosis not present

## 2023-03-24 DIAGNOSIS — F4381 Prolonged grief disorder: Secondary | ICD-10-CM | POA: Diagnosis not present

## 2023-03-31 DIAGNOSIS — F4381 Prolonged grief disorder: Secondary | ICD-10-CM | POA: Diagnosis not present

## 2023-04-01 ENCOUNTER — Encounter: Payer: Self-pay | Admitting: Family Medicine

## 2023-04-01 ENCOUNTER — Encounter: Payer: Self-pay | Admitting: Neurology

## 2023-04-01 ENCOUNTER — Ambulatory Visit: Payer: BC Managed Care – PPO | Admitting: Neurology

## 2023-04-01 VITALS — BP 153/101 | HR 94 | Ht 65.0 in | Wt 312.0 lb

## 2023-04-01 DIAGNOSIS — R531 Weakness: Secondary | ICD-10-CM

## 2023-04-01 DIAGNOSIS — R200 Anesthesia of skin: Secondary | ICD-10-CM | POA: Diagnosis not present

## 2023-04-01 DIAGNOSIS — Z820 Family history of epilepsy and other diseases of the nervous system: Secondary | ICD-10-CM

## 2023-04-01 NOTE — Patient Instructions (Signed)
MRI brain with and without contrast, I will contact you to go over the result Continue to follow with PCP Return as needed.

## 2023-04-01 NOTE — Progress Notes (Signed)
[facility]  PATIENT: Krystal Roberson DOB: 1975-05-27  REQUESTING CLINICIAN: Marita Kansas, PA-C HISTORY FROM: Patient  REASON FOR VISIT: Numbness/Weakness, concern for demyelinated disease    HISTORICAL  CHIEF COMPLAINT:  Chief Complaint  Patient presents with   New Patient (Initial Visit)    Rm13, alone,  Bad headache: daily/worse at times, generalized weakness     HISTORY OF PRESENT ILLNESS:   Discussed the use of AI scribe software for clinical note transcription with the patient, who gave verbal consent to proceed.  History of Present Illness   The patient is a 47 year old woman with a history of hypothyroidism, obesity, hyperlipidemia, heart flutter who presents with concerns of symptoms reminiscent of her mother's Multiple Sclerosis (MS). She reports experiencing tingling, predominantly in her hands, and occasional weakness in her legs, which she describes as an inability to move properly, particularly when ascending stairs. The patient notes that these symptoms are not constant but occur in episodes lasting a few days, after which she feels relatively well.  Over the past few years, the patient has noticed an increase in these symptoms, which she finds frustrating, especially when trying to work. She also reports a significant weight gain following a hysterectomy, which has added to her discomfort.  The patient has also experienced blurry vision, which she attributes to a new medication, Diltiazem. She has not noticed any loss of vision. 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 She was also worked up for Myasthenia Gravis, both Acetylcholine antibody and MUSK antibodies were negative.   The patient has a history of hypothyroidism and is currently on Synthroid, which was recently increased due to high TSH detected during a hospital stay for a heart condition. She has not noticed any significant improvement since the  increase.  The patient's mother had MS and fibromyalgia, and the patient is concerned that her symptoms might be indicative of a similar condition. She reports that her mother had a lot of weakness, chronic pain, and difficulty moving, eventually requiring a scooter for mobility. The patient's mother was diagnosed with MS after experiencing symptoms for ten to fifteen years.  The patient has not had any recent falls and is due for a follow-up with her primary care physician in a month.       OTHER MEDICAL CONDITIONS: Hypothyroidism, Obesity, Migraines headaches, Atrial flutter and hyperlipidemia    REVIEW OF SYSTEMS: Full 14 system review of systems performed and negative with exception of: As noted in the HPI   ALLERGIES: Allergies  Allergen Reactions   Penicillins Hives and Rash    Has patient had a PCN reaction causing immediate rash, facial/tongue/throat swelling, SOB or lightheadedness with hypotension:    Zoloft [Sertraline]     Suicidal thoughts     HOME MEDICATIONS: Outpatient Medications Prior to Visit  Medication Sig Dispense Refill   Cholecalciferol (VITAMIN D) 125 MCG (5000 UT) CAPS Take 5,000 Units by mouth daily.      diltiazem (CARDIZEM) 120 MG tablet Take 120 mg by mouth daily.     levothyroxine (SYNTHROID) 200 MCG tablet Take 1 tablet (200 mcg total) by mouth daily. 90 tablet 3   atorvastatin (LIPITOR) 40 MG tablet Take 40 mg by mouth daily.     Semaglutide,0.25 or 0.5MG /DOS, (OZEMPIC, 0.25 OR 0.5 MG/DOSE,) 2 MG/3ML SOPN Inject 0.25 mg into the skin once a week. 3 mL 5   No facility-administered medications prior to visit.    PAST MEDICAL HISTORY:  Past Medical History:  Diagnosis Date   Allergy    Asthma    Depression    Headache(784.0)    Migraine    Thyroid disease    UTI (lower urinary tract infection)     PAST SURGICAL HISTORY: Past Surgical History:  Procedure Laterality Date   ABDOMINAL HYSTERECTOMY  04/02/2019   [facility]omet[facility]osis    CESAREAN SECTION     CHOLECYSTECTOMY  03-2014   at [facility]    ENDOMETRIAL ABLATION W/ NOVASURE  2017   per Dr. Cherly Hensen    VAGINAL PROLAPSE REPAIR  12/29/2019    FAMILY HISTORY: Family History  Problem Relation Age of Onset   Thyroid disease Mother    Endometrial cancer Mother    Diabetes Father    Hypertension Father    COPD Father    Arthritis Other    Cancer Other        breast, 1st degree, less 50   Diabetes Other        1st degree    Hypertension Other    Depression Other    Stroke Other        1st degree, less 50   Coronary artery disease Other     SOCIAL HISTORY: Social History   Socioeconomic History   Marital status: Married    Spouse name: Not on file   Number of children: Not on file   Years of education: Not on file   Highest education level: Not on file  Occupational History   Not on file  Tobacco Use   Smoking status: Never   Smokeless tobacco: Never  Vaping Use   Vaping status: Never Used  Substance and Sexual Activity   Alcohol use: No    Alcohol/week: 0.0 standard drinks of alcohol   Drug use: No   Sexual activity: Not on file  Other Topics Concern   Not on file  Social History Narrative   Widow since Nov 2019,married for 21 years.Lives with 2 daughters.   Social Determinants of Health   Financial Resource Strain: Not on file  Food Insecurity: Low Risk  (03/20/2023)   Received from [facility]   Hunger Vital Sign    Worried About Running Out of Food in the Last Year: Never true    Ran Out of Food in the Last Year: Never true  Transportation Needs: No Transportation Needs (03/20/2023)   Received from [facility]    In the past 12 months, has lack of reliable transportation kept you from medical appointments, meetings, work or from getting things needed for daily living? : No  Physical Activity: Not on file  Stress: Stress Concern Present (12/29/2019)   Received from [facility], [facility]   [employer] - Occupational Stress Questionnaire    Feeling of Stress : Very much  Social Connections: Unknown (09/21/2021)   Received from [facility], [facility]   Social Network    Social Network: Not on file  Intimate Partner Violence: Unknown (08/14/2021)   Received from [facility], [facility]   HITS    Physically Hurt: Not on file    Insult or Talk Down To: Not on file    Threaten Physical Harm: Not on file    Scream or Curse: Not on file    PHYSICAL EXAM  GENERAL EXAM/CONSTITUTIONAL: Vitals:  Vitals:   04/01/23 0817 04/01/23 0820  BP: (!) 144/94 (!) 153/101  Pulse:  94  Weight:  (!) 312  lb (141.5 kg)  Height:  5\' 5"  (1.651 m)   Body mass index is 51.92 kg/m. Wt Readings from Last 3 Encounters:  04/01/23 (!) 312 lb (141.5 kg)  03/18/23 (!) 312 lb (141.5 kg)  02/24/23 (!) 310 lb (140.6 kg)   Patient is in no distress; well developed, nourished and groomed; neck is supple. Obese woman   MUSCULOSKELETAL: Gait, strength, tone, movements noted in Neurologic exam below  NEUROLOGIC: MENTAL STATUS:      No data to display         awake, alert, oriented to person, place and time recent and remote memory intact normal attention and concentration language fluent, comprehension intact, naming intact fund of knowledge appropriate  CRANIAL NERVE:  2nd, 3rd, 4th, 6th - pupils equal and reactive to light, visual fields full to confrontation, extraocular muscles intact, no nystagmus 5th - facial sensation symmetric 7th - facial strength symmetric 8th - hearing intact 9th - palate elevates symmetrically, uvula midline 11th - shoulder shrug symmetric 12th - tongue protrusion midline  MOTOR:  normal bulk and tone, full strength in the BUE, BLE  SENSORY:  normal and symmetric to light touch, pinprick  COORDINATION:  finger-nose-finger, fine finger movements normal  GAIT/STATION:  normal   DIAGNOSTIC DATA (LABS, IMAGING,  TESTING) - I reviewed patient records, labs, notes, testing and imaging myself where available.  Lab Results  Component Value Date   WBC 11.0 (H) 01/11/2023   HGB 13.8 01/11/2023   HCT 42.6 01/11/2023   MCV 86.2 01/11/2023   PLT 296 01/11/2023      Component Value Date/Time   NA 136 01/11/2023 1457   K 3.9 01/11/2023 1457   CL 101 01/11/2023 1457   CO2 26 01/11/2023 1457   GLUCOSE 159 (H) 01/11/2023 1457   GLUCOSE 85 05/23/2006 1243   BUN 17 01/11/2023 1457   CREATININE 0.84 01/11/2023 1457   CALCIUM 8.6 (L) 01/11/2023 1457   PROT 7.8 01/22/2022 1417   ALBUMIN 4.0 01/22/2022 1417   AST 18 01/22/2022 1417   ALT 23 01/22/2022 1417   ALKPHOS 140 (H) 01/22/2022 1417   BILITOT 0.3 01/22/2022 1417   GFRNONAA >60 01/11/2023 1457   GFRAA >60 08/06/2019 1532   Lab Results  Component Value Date   CHOL 139 05/21/2018   HDL 32.30 (L) 05/21/2018   LDLCALC 88 05/21/2018   TRIG 95.0 05/21/2018   CHOLHDL 4 05/21/2018   Lab Results  Component Value Date   HGBA1C 6.1 (A) 08/12/2022   Lab Results  Component Value Date   VITAMINB12 366 03/28/2015   Lab Results  Component Value Date   TSH 11.228 (H) 01/11/2023    Head CT 10/07/2019 Normal CT head.   MRI Brain 04/01/2016 1. No acute intracranial abnormality or abnormal enhancement. 2. No abnormal enhancement of the internal auditory canals, cerebellopontine angles, or facial nerve complexes is identified bilaterally. 3. Unremarkable MRI of the brain for age.    ASSESSMENT AND PLAN  47 y.o. year old female with   Assessment and Plan    Possible Multiple Sclerosis (MS)   Intermittent tingling in hands and legs, generalized weakness, and a family history of Multiple Sclerosis (MS), although symptoms are atypical, will obtain brain images. The absence of typical MS symptoms such as focal weakness and vision loss is noted. An MRI brain with and without contrast, being the primary diagnostic tool, will be ordered. She  understands the need for this MRI. We will contact her with the results via  MyChart or phone.  Hypothyroidism   She is managed on Synthroid for hypothyroidism, with a recent dose increase due to elevated thyroid levels. Symptoms including fatigue, weight gain, and weakness may be related to hypothyroidism. We discussed the importance of taking Synthroid in the morning on an empty stomach, 30 minutes before eating or drinking, especially since she had altered the administration time without consultation with her PCP. Thyroid levels will be checked by her PCP at next month visit, and she is advised to revert to the correct Synthroid administration timing.  Atrial Flutter   Her atrial flutter is managed with diltiazem.   Ptosis   Despite multiple surgeries, the etiology of her ptosis remains unclear, with previous tests for myasthenia gravis returning negative. She reports facial drooping and has had eyelid surgeries but no new symptoms.  Follow-up   She will follow up with Dr. Clent Ridges in one month for a thyroid check. We will also contact her with the MRI results and discuss next steps if any abnormalities are found.       1. Numbness   2. Weakness   3. Family history of MS (multiple sclerosis)     Patient Instructions  MRI brain with and without contrast, I will contact you to go over the result Continue to follow with PCP Return as needed.   Orders Placed This Encounter  Procedures   MR BRAIN W WO CONTRAST    No orders of the defined types were placed in this encounter.   Return if symptoms worsen or fail to improve.    Windell Norfolk, MD 04/01/2023, 12:54 PM  [facility] [address], [address] [place], [place] [address] [phone]

## 2023-04-02 ENCOUNTER — Telehealth: Payer: Self-pay | Admitting: Neurology

## 2023-04-02 NOTE — Telephone Encounter (Signed)
[employer]BS Berkley Harvey: [id number] exp. 04/02/23-05/01/23 sent to GI due to her weight. [phone]

## 2023-04-03 ENCOUNTER — Other Ambulatory Visit: Payer: Self-pay | Admitting: Family Medicine

## 2023-04-03 MED ORDER — WEGOVY 0.25 MG/0.5ML ~~LOC~~ SOAJ: 0.2500 mg | SUBCUTANEOUS | 2 refills | Status: DC

## 2023-04-03 NOTE — Telephone Encounter (Signed)
I sent in a RX for Weygovy. We'll see what happens.

## 2023-04-07 DIAGNOSIS — F4381 Prolonged grief disorder: Secondary | ICD-10-CM | POA: Diagnosis not present

## 2023-04-14 DIAGNOSIS — F4381 Prolonged grief disorder: Secondary | ICD-10-CM | POA: Diagnosis not present

## 2023-04-21 DIAGNOSIS — F4381 Prolonged grief disorder: Secondary | ICD-10-CM | POA: Diagnosis not present

## 2023-04-28 DIAGNOSIS — F4381 Prolonged grief disorder: Secondary | ICD-10-CM | POA: Diagnosis not present

## 2023-05-20 ENCOUNTER — Encounter: Payer: Self-pay | Admitting: Neurology

## 2023-05-23 ENCOUNTER — Other Ambulatory Visit: Payer: BC Managed Care – PPO

## 2023-05-27 ENCOUNTER — Encounter: Payer: Self-pay | Admitting: Family Medicine

## 2023-05-28 ENCOUNTER — Ambulatory Visit: Payer: Self-pay | Admitting: Family Medicine

## 2023-05-28 ENCOUNTER — Ambulatory Visit
Admission: RE | Admit: 2023-05-28 | Discharge: 2023-05-28 | Disposition: A | Discharge status: Home / Self Care | Payer: BC Managed Care – PPO | Source: Ambulatory Visit | Attending: Neurology | Admitting: Neurology

## 2023-05-28 DIAGNOSIS — R531 Weakness: Secondary | ICD-10-CM

## 2023-05-28 DIAGNOSIS — R200 Anesthesia of skin: Secondary | ICD-10-CM

## 2023-05-28 DIAGNOSIS — R29810 Facial weakness: Secondary | ICD-10-CM | POA: Diagnosis not present

## 2023-05-28 DIAGNOSIS — R202 Paresthesia of skin: Secondary | ICD-10-CM | POA: Diagnosis not present

## 2023-05-28 DIAGNOSIS — Z820 Family history of epilepsy and other diseases of the nervous system: Secondary | ICD-10-CM

## 2023-05-28 DIAGNOSIS — G379 Demyelinating disease of central nervous system, unspecified: Secondary | ICD-10-CM | POA: Diagnosis not present

## 2023-05-28 MED ORDER — GADOPICLENOL 0.5 MMOL/ML IV SOLN
10.0000 mL | Freq: Once | INTRAVENOUS | Status: AC | PRN
  Administered 2023-05-28: 10 mL via INTRAVENOUS

## 2023-05-28 NOTE — Telephone Encounter (Signed)
 Copied from CRM [id number]. Topic: Clinical - Red Word Triage >> May 28, 2023  8:13 AM Freya Jesus wrote: Red Word that prompted transfer to Nurse Triage: UTI, swelling in feet and vision problems.   Chief Complaint: blurred vision Symptoms: blurred vision x 2 hours Frequency: progressing Pertinent Negatives: Patient denies headache, neuro deficits, or chest pain Disposition: [x] ED /[] Urgent Care (no appt availability in office) / [] Appointment(In office/virtual)/ []  [facility]/ [] Home Care/ [] Refused Recommended Disposition /[] [place] [place] [facility]/ []  Follow-up with PCP Additional Notes: Pt reports blurred vision in both eyes that started this morning around 5 or 6am. Pt states that it is getting worse and she is having trouble seeing road signs while driving. She denies headache, arm or leg weakness, or any trouble with her speech. Pt advised to go the ED, she is agreeable. States that she ill go to [facility].  Reason for Disposition  [1] Blurred vision or visual changes AND [2] present now AND [3] sudden onset or new (e.g., minutes, hours, days)  (Exception: Seeing floaters / black specks OR previously diagnosed migraine headaches with same symptoms.)  Answer Assessment - Initial Assessment Questions 1. DESCRIPTION: "How has your vision changed?" (e.g., complete vision loss, blurred vision, double vision, floaters, etc.)     Blurred  2. LOCATION: "One or both eyes?" If one, ask: "Which eye?"     Both eyes  3. SEVERITY: "Can you see anything?" If Yes, ask: "What can you see?" (e.g., fine print)     Can see, just very blurred. Can't read signs while driving  4. ONSET: "When did this begin?" "Did it start suddenly or has this been gradual?"     Started this morning at 5 or 6 am  5. PATTERN: "Does this come and go, or has it been constant since it started?"     Constant, getting worse  6. PAIN: "Is there any pain in your eye(s)?"  (Scale 1-10; or mild, moderate, severe)   - NONE  (0): No pain.   - MILD (1-3): Doesn't interfere with normal activities.   - MODERATE (4-7): Interferes with normal activities or awakens from sleep.    - SEVERE (8-10): Excruciating pain, unable to do any normal activities.     No pain  7. CONTACTS-GLASSES: "Do you wear contacts or glasses?"     Glasses  8. CAUSE: "What do you think is causing this visual problem?"     Unsure of what is causing   9. OTHER SYMPTOMS: "Do you have any other symptoms?" (e.g., confusion, headache, arm or leg weakness, speech problems)     Both eyes are reddended  10. PREGNANCY: "Is there any chance you are pregnant?" "When was your last menstrual period?"       No  Protocols used: Vision Loss or Change-A-AH

## 2023-05-28 NOTE — Telephone Encounter (Signed)
 Left a message for pt advise to call the office back regarding this Triage note

## 2023-05-29 ENCOUNTER — Ambulatory Visit: Payer: BC Managed Care – PPO | Admitting: Internal Medicine

## 2023-05-29 ENCOUNTER — Telehealth: Payer: BC Managed Care – PPO | Admitting: Family Medicine

## 2023-05-29 ENCOUNTER — Encounter: Payer: Self-pay | Admitting: Internal Medicine

## 2023-05-29 VITALS — BP 122/82 | HR 88 | Temp 98.3°F | Wt 315.0 lb

## 2023-05-29 DIAGNOSIS — R300 Dysuria: Secondary | ICD-10-CM

## 2023-05-29 DIAGNOSIS — R3989 Other symptoms and signs involving the genitourinary system: Secondary | ICD-10-CM

## 2023-05-29 LAB — POC URINALSYSI DIPSTICK (AUTOMATED)
Bilirubin, UA: NEGATIVE
Blood, UA: NEGATIVE
Glucose, UA: NEGATIVE
Ketones, UA: NEGATIVE
Nitrite, UA: NEGATIVE
Protein, UA: POSITIVE — AB
Spec Grav, UA: 1.015 (ref 1.010–1.025)
Urobilinogen, UA: 0.2 U/dL
pH, UA: 7 (ref 5.0–8.0)

## 2023-05-29 MED ORDER — SULFAMETHOXAZOLE-TRIMETHOPRIM 800-160 MG PO TABS: 1.0000 | ORAL_TABLET | Freq: Two times a day (BID) | ORAL | 0 refills | Status: AC

## 2023-05-29 NOTE — Progress Notes (Signed)
  Because you are having UTI symptoms with back, belly pain and fevers your condition warrants further evaluation and I recommend that you be seen in a face-to-face visit at your PCP office or local urgent care for a sample and possible labs if needed.   NOTE: There will be NO CHARGE for this E-Visit   If you are having a true medical emergency, please call 911.

## 2023-05-29 NOTE — Progress Notes (Addendum)
     Established Patient Office Visit     CC/Reason for Visit: "I think I have a UTI"  HPI: Krystal Roberson is a 48 y.o. female who is coming in today for the above mentioned reasons.  For the past few days has been having increased urinary frequency with low urine output.  Subjective fever and chills.  This feels like her previous UTI.   Past Medical/Surgical History: Past Medical History:  Diagnosis Date   Allergy    Asthma    Depression    Headache(784.0)    Migraine    Thyroid disease    UTI (lower urinary tract infection)     Past Surgical History:  Procedure Laterality Date   ABDOMINAL HYSTERECTOMY  04/02/2019   [facility]   CESAREAN SECTION     CHOLECYSTECTOMY  03-2014   at [facility]    ENDOMETRIAL ABLATION W/ NOVASURE  2017   per Dr. Cherly Hensen    VAGINAL PROLAPSE REPAIR  12/29/2019    Social History:  reports that she has never smoked. She has never used smokeless tobacco. She reports that she does not drink alcohol and does not use drugs.  Allergies: PCN   Family History:  Family History  Problem Relation Age of Onset   Thyroid disease Mother    Endometrial cancer Mother    Diabetes Father    Hypertension Father    COPD Father    Arthritis Other    Cancer Other        breast, 1st degree, less 50   Diabetes Other        1st degree    Hypertension Other    Depression Other    Stroke Other        1st degree, less 50   Coronary artery disease Other      Current Outpatient Medications:    Cholecalciferol (VITAMIN D) 125 MCG (5000 UT) CAPS, Take 5,000 Units by mouth daily. , Disp: , Rfl:    diltiazem (CARDIZEM) 120 MG tablet, Take 120 mg by mouth daily., Disp: , Rfl:    levothyroxine (SYNTHROID) 200 MCG tablet, Take 1 tablet (200 mcg total) by mouth daily., Disp: 90 tablet, Rfl: 3   sulfamethoxazole-trimethoprim (BACTRIM DS) 800-160 MG tablet, Take 1 tablet by mouth 2 (two) times daily for 5 days., Disp: 10 tablet, Rfl:  0  Review of Systems:  Negative unless indicated in HPI.   Physical Exam: Vitals:   05/29/23 1448  BP: 122/82  Pulse: 88  Temp: 98.3 F (36.8 C)  TempSrc: Oral  SpO2: 98%  Weight: (!) 315 lb (142.9 kg)    Body mass index is 52.42 kg/m.     Impression and Plan:  Dysuria -     POCT Urinalysis Dipstick (Automated) -     Urinalysis; Future -     Urine Culture; Future -     Sulfamethoxazole-Trimethoprim; Take 1 tablet by mouth 2 (two) times daily for 5 days.  Dispense: 10 tablet; Refill: 0  -In office urine dipstick is positive for leukocytes.  Will treat with Bactrim for 5 days and send for urine culture.   Time spent:22 minutes reviewing chart, interviewing and examining patient and formulating plan of care.     Chaya Jan, MD [facility]

## 2023-05-29 NOTE — Addendum Note (Signed)
Addended by: Henderson Cloud on: 05/29/2023 03:27 PM   Modules accepted: Orders

## 2023-05-29 NOTE — Addendum Note (Signed)
Addended by: Kern Reap B on: 05/29/2023 03:26 PM   Modules accepted: Orders

## 2023-05-30 LAB — URINALYSIS
Bilirubin Urine: NEGATIVE
Hgb urine dipstick: NEGATIVE
Ketones, ur: NEGATIVE
Leukocytes,Ua: NEGATIVE
Nitrite: NEGATIVE
Specific Gravity, Urine: 1.015 (ref 1.000–1.030)
Urine Glucose: NEGATIVE
Urobilinogen, UA: 0.2 (ref 0.0–1.0)
pH: 7.5 (ref 5.0–8.0)

## 2023-05-30 MED ORDER — DIAZEPAM 5 MG PO TABS: ORAL_TABLET | ORAL | 0 refills | Status: AC

## 2023-05-30 NOTE — Telephone Encounter (Signed)
I sent in some Valium

## 2023-05-31 LAB — URINE CULTURE
MICRO NUMBER:: 15964625
SPECIMEN QUALITY:: ADEQUATE

## 2023-06-01 ENCOUNTER — Encounter: Payer: Self-pay | Admitting: Neurology

## 2023-06-02 DIAGNOSIS — F4381 Prolonged grief disorder: Secondary | ICD-10-CM | POA: Diagnosis not present

## 2023-06-02 NOTE — Telephone Encounter (Signed)
Pt had appointment on 05/29/23 with Dr Ardyth Harps for this problem

## 2023-06-09 DIAGNOSIS — F4381 Prolonged grief disorder: Secondary | ICD-10-CM | POA: Diagnosis not present

## 2023-06-20 DIAGNOSIS — R0683 Snoring: Secondary | ICD-10-CM | POA: Diagnosis not present

## 2023-06-20 DIAGNOSIS — I4892 Unspecified atrial flutter: Secondary | ICD-10-CM | POA: Diagnosis not present

## 2023-06-20 DIAGNOSIS — R5383 Other fatigue: Secondary | ICD-10-CM | POA: Diagnosis not present

## 2023-06-22 DIAGNOSIS — R9431 Abnormal electrocardiogram [ECG] [EKG]: Secondary | ICD-10-CM | POA: Diagnosis not present

## 2023-07-07 DIAGNOSIS — F4381 Prolonged grief disorder: Secondary | ICD-10-CM | POA: Diagnosis not present

## 2023-07-21 DIAGNOSIS — F4381 Prolonged grief disorder: Secondary | ICD-10-CM | POA: Diagnosis not present

## 2023-08-04 DIAGNOSIS — F4381 Prolonged grief disorder: Secondary | ICD-10-CM | POA: Diagnosis not present

## 2023-09-16 ENCOUNTER — Telehealth: Payer: Self-pay

## 2023-09-16 NOTE — Telephone Encounter (Signed)
 Copied from CRM [id number]. Topic: Clinical - Request for Lab/Test Order >> Sep 16, 2023 10:33 AM Krystal Roberson wrote: Reason for CRM: Patient would like to have her thyroid  levels checked

## 2023-09-17 ENCOUNTER — Other Ambulatory Visit: Payer: Self-pay

## 2023-09-17 MED ORDER — LEVOTHYROXINE SODIUM 200 MCG PO TABS: 200.0000 ug | ORAL_TABLET | Freq: Every day | ORAL | 0 refills | Status: DC

## 2023-11-13 DIAGNOSIS — R309 Painful micturition, unspecified: Secondary | ICD-10-CM | POA: Diagnosis not present

## 2023-11-13 DIAGNOSIS — R509 Fever, unspecified: Secondary | ICD-10-CM | POA: Diagnosis not present

## 2023-11-24 ENCOUNTER — Other Ambulatory Visit: Payer: Self-pay | Admitting: Family Medicine

## 2023-12-25 DIAGNOSIS — I483 Typical atrial flutter: Secondary | ICD-10-CM | POA: Diagnosis not present

## 2023-12-25 DIAGNOSIS — R0683 Snoring: Secondary | ICD-10-CM | POA: Diagnosis not present

## 2023-12-27 ENCOUNTER — Other Ambulatory Visit: Payer: Self-pay | Admitting: Family Medicine

## 2023-12-29 DIAGNOSIS — Z973 Presence of spectacles and contact lenses: Secondary | ICD-10-CM | POA: Diagnosis not present

## 2024-01-21 DIAGNOSIS — F4381 Prolonged grief disorder: Secondary | ICD-10-CM | POA: Diagnosis not present

## 2024-03-17 DIAGNOSIS — F4381 Prolonged grief disorder: Secondary | ICD-10-CM | POA: Diagnosis not present

## 2024-04-15 DIAGNOSIS — F4381 Prolonged grief disorder: Secondary | ICD-10-CM | POA: Diagnosis not present
